# Patient Record
Sex: Female | Born: 1945
Health system: Southern US, Community
[De-identification: ages and names within clinical notes are randomized; demographics above are authoritative.]

## PROBLEM LIST (undated history)

## (undated) DIAGNOSIS — R0602 Shortness of breath: Secondary | ICD-10-CM

## (undated) DIAGNOSIS — I447 Left bundle-branch block, unspecified: Secondary | ICD-10-CM

## (undated) DIAGNOSIS — I499 Cardiac arrhythmia, unspecified: Secondary | ICD-10-CM

## (undated) DIAGNOSIS — Z0389 Encounter for observation for other suspected diseases and conditions ruled out: Secondary | ICD-10-CM

## (undated) DIAGNOSIS — Z7901 Long term (current) use of anticoagulants: Secondary | ICD-10-CM

## (undated) DIAGNOSIS — I10 Essential (primary) hypertension: Secondary | ICD-10-CM

## (undated) DIAGNOSIS — I480 Paroxysmal atrial fibrillation: Secondary | ICD-10-CM

## (undated) HISTORY — PX: BREAST EXCISIONAL BIOPSY: SUR124

## (undated) HISTORY — DX: Encounter for observation for other suspected diseases and conditions ruled out: Z03.89

## (undated) HISTORY — DX: Paroxysmal atrial fibrillation: I48.0

## (undated) HISTORY — PX: CHOLECYSTECTOMY: SHX55

## (undated) HISTORY — PX: SHOULDER SURGERY: SHX246

## (undated) HISTORY — PX: BREAST SURGERY: SHX581

## (undated) HISTORY — DX: Left bundle-branch block, unspecified: I44.7

## (undated) HISTORY — DX: Long term (current) use of anticoagulants: Z79.01

## (undated) HISTORY — PX: WRIST FRACTURE SURGERY: SHX121

## (undated) HISTORY — PX: APPENDECTOMY: SHX54

---

## 1999-03-14 ENCOUNTER — Other Ambulatory Visit: Admission: RE | Admit: 1999-03-14 | Discharge: 1999-03-14 | Payer: Self-pay | Admitting: Family Medicine

## 1999-09-23 ENCOUNTER — Other Ambulatory Visit: Admission: RE | Admit: 1999-09-23 | Discharge: 1999-09-23 | Payer: Self-pay | Admitting: Family Medicine

## 2000-05-12 ENCOUNTER — Other Ambulatory Visit: Admission: RE | Admit: 2000-05-12 | Discharge: 2000-05-12 | Payer: Self-pay | Admitting: Family Medicine

## 2000-05-15 ENCOUNTER — Encounter: Admission: RE | Admit: 2000-05-15 | Discharge: 2000-05-15 | Payer: Self-pay | Admitting: Family Medicine

## 2000-05-15 ENCOUNTER — Encounter: Payer: Self-pay | Admitting: Family Medicine

## 2000-05-25 ENCOUNTER — Encounter: Admission: RE | Admit: 2000-05-25 | Discharge: 2000-05-25 | Payer: Self-pay | Admitting: Family Medicine

## 2000-05-25 ENCOUNTER — Encounter: Payer: Self-pay | Admitting: Family Medicine

## 2000-12-25 ENCOUNTER — Inpatient Hospital Stay (HOSPITAL_COMMUNITY): Admission: EM | Admit: 2000-12-25 | Discharge: 2000-12-29 | Payer: Self-pay | Admitting: *Deleted

## 2000-12-25 ENCOUNTER — Encounter: Payer: Self-pay | Admitting: General Surgery

## 2000-12-25 ENCOUNTER — Encounter (INDEPENDENT_AMBULATORY_CARE_PROVIDER_SITE_OTHER): Payer: Self-pay | Admitting: Specialist

## 2000-12-27 ENCOUNTER — Encounter: Payer: Self-pay | Admitting: General Surgery

## 2001-07-09 ENCOUNTER — Encounter: Payer: Self-pay | Admitting: Family Medicine

## 2001-07-09 ENCOUNTER — Encounter: Admission: RE | Admit: 2001-07-09 | Discharge: 2001-07-09 | Payer: Self-pay | Admitting: Family Medicine

## 2001-07-19 ENCOUNTER — Encounter: Payer: Self-pay | Admitting: Family Medicine

## 2001-07-19 ENCOUNTER — Encounter: Admission: RE | Admit: 2001-07-19 | Discharge: 2001-07-19 | Payer: Self-pay | Admitting: Family Medicine

## 2002-10-27 DIAGNOSIS — IMO0001 Reserved for inherently not codable concepts without codable children: Secondary | ICD-10-CM

## 2002-10-27 HISTORY — DX: Reserved for inherently not codable concepts without codable children: IMO0001

## 2002-10-27 HISTORY — PX: LEFT HEART CATH: CATH118248

## 2003-06-22 ENCOUNTER — Emergency Department (HOSPITAL_COMMUNITY): Admission: EM | Admit: 2003-06-22 | Discharge: 2003-06-22 | Payer: Self-pay | Admitting: Emergency Medicine

## 2003-06-22 ENCOUNTER — Encounter: Payer: Self-pay | Admitting: *Deleted

## 2003-07-25 ENCOUNTER — Ambulatory Visit (HOSPITAL_COMMUNITY): Admission: RE | Admit: 2003-07-25 | Discharge: 2003-07-25 | Payer: Self-pay | Admitting: *Deleted

## 2004-06-03 ENCOUNTER — Other Ambulatory Visit: Admission: RE | Admit: 2004-06-03 | Discharge: 2004-06-03 | Payer: Self-pay | Admitting: Family Medicine

## 2005-10-16 ENCOUNTER — Ambulatory Visit (HOSPITAL_COMMUNITY): Admission: RE | Admit: 2005-10-16 | Discharge: 2005-10-16 | Payer: Self-pay | Admitting: *Deleted

## 2006-01-16 ENCOUNTER — Encounter (INDEPENDENT_AMBULATORY_CARE_PROVIDER_SITE_OTHER): Payer: Self-pay | Admitting: Specialist

## 2006-01-16 ENCOUNTER — Ambulatory Visit (HOSPITAL_COMMUNITY): Admission: RE | Admit: 2006-01-16 | Discharge: 2006-01-16 | Payer: Self-pay | Admitting: General Surgery

## 2006-08-06 ENCOUNTER — Encounter: Admission: RE | Admit: 2006-08-06 | Discharge: 2006-08-06 | Payer: Self-pay | Admitting: Family Medicine

## 2006-10-08 ENCOUNTER — Other Ambulatory Visit: Admission: RE | Admit: 2006-10-08 | Discharge: 2006-10-08 | Payer: Self-pay | Admitting: Family Medicine

## 2009-09-26 ENCOUNTER — Other Ambulatory Visit: Admission: RE | Admit: 2009-09-26 | Discharge: 2009-09-26 | Payer: Self-pay | Admitting: Family Medicine

## 2009-09-27 ENCOUNTER — Ambulatory Visit: Payer: Self-pay | Admitting: Diagnostic Radiology

## 2009-09-27 ENCOUNTER — Emergency Department (HOSPITAL_BASED_OUTPATIENT_CLINIC_OR_DEPARTMENT_OTHER): Admission: EM | Admit: 2009-09-27 | Discharge: 2009-09-27 | Payer: Self-pay | Admitting: Emergency Medicine

## 2009-11-07 ENCOUNTER — Encounter: Admission: RE | Admit: 2009-11-07 | Discharge: 2010-01-22 | Payer: Self-pay | Admitting: Family Medicine

## 2010-02-06 ENCOUNTER — Encounter: Admission: RE | Admit: 2010-02-06 | Discharge: 2010-04-02 | Payer: Self-pay | Admitting: Orthopedic Surgery

## 2011-03-14 NOTE — Cardiovascular Report (Signed)
Alexandra Rogers, Alexandra Rogers                     ACCOUNT NO.:  [id number]   MEDICAL RECORD NO.:  [id number]                   PATIENT TYPE:  OIB   LOCATION:  2852                                 FACILITY:  [facility]   PHYSICIAN:  Meade Maw, M.D.                 DATE OF BIRTH:  Jul 18, 1946   DATE OF PROCEDURE:  07/25/2003  DATE OF DISCHARGE:                              CARDIAC CATHETERIZATION   INDICATION FOR PROCEDURE:  A 65 year old female noted to have presyncope and  exercise induced left bundle branch block.  She underwent a stress  Cardiolite.  This revealed decreased uptake in the anterior septal region  with minimal reversal.  After considering her options, the patient wishes to  proceed with left heart catheterization.   PROCEDURE:  After obtaining written informed consent, the patient was  brought to the cardiac catheterization lab in a postabsorptive state.  Preop  sedation was achieved using IV Versed.  The right groin was prepped and  draped in the usual sterile fashion.  Local anesthesia was achieved using 1%  Xylocaine.  A 6 French hemostasis sheath was placed into the right femoral  artery.  Selective coronary angiography was performed with a JL-4 and JR-4  Judkins catheter.  Multiple views were obtained.  All catheter exchanges  were made over a guide wire.  Single plane ventriculogram was performed in  the RAO position using a 6 French pigtail curved catheter.  There was no  identifiable disease.  There was spasm noted in the left main coronary  artery with initial introduction of the catheter.  This resolved with 200  mcg of intracoronary nitroglycerin.   FINDINGS:  1. Aortic pressure was 125/67.  2. LV pressure 126/5.  3. EDP 14.  4. Single plane ventriculogram revealed normal wall motion.  Ejection     fraction of 65%.   CORONARY ANGIOGRAPHY:  1. The left main coronary artery bifurcates into the left anterior     descending and circumflex vessel.  There is  no disease noted in the left     main coronary artery.  2. LAD:  Left anterior descending gives rise to a trivial D1, trivial D2,     large D3 and goes on in as an apical branch.  There is no disease in the     left anterior descending or its branches.  3. Circumflex:  Circumflex vessel gives rise to a trivial OM-1, large OM-2,     moderate OM-3 and goes on in as a lateral branch.  There is no disease     noted in the circumflex vessel or its branches.  4. The right coronary artery:  The right coronary artery is dominant for the     posterior circulation.  It gives rise to a trivial RV marginal one and     two, large PDA, large PL branch.  There is no disease noted in the right  coronary artery or its branches.   FINAL IMPRESSION:  1. Normal coronary angiography and normal single plane ventriculogram with     ejection fraction of 65%.  2.     Exercise induced left bundle branch block not attributable to coronary     artery disease.  3. Paroxysmal atrial fibrillation.  The patient has no other risk factors     for thromboembolism.  Therefore, will not initiate Coumadin as this time.                                                 Meade Maw, M.D.    HP/MEDQ  D:  07/25/2003  T:  07/25/2003  Job:  [id number]

## 2011-03-14 NOTE — H&P (Signed)
[facility]  Patient:    Alexandra Rogers, Alexandra Rogers                  MRN: [id number] Adm. Date:  [id number] Attending:  Cherylynn Ridges                         History and Physical  IDENTIFICATION AND CHIEF COMPLAINT:  The patient is a 65 year old female with likely acute appendicitis.  HISTORY OF PRESENT ILLNESS:  The patient was in her usual state of good health until she developed acute onset of mid-upper and right lower quadrant abdominal pain associated with fevers and chills, but no nausea or vomiting and no diarrhea or constipation.  She went to see an urgent care family practice doctor who thought the patient to likely have acute appendicitis; her WBC count at that facility was 20,000 and she had a left shift.  She also had a fever.  She had right lower quadrant tenderness with rebound guarding and she was thought to have acute appendicitis and she was sent into the emergency room for evaluation.  PAST MEDICAL HISTORY:  Unremarkable.  She has no history of coronary, renal, pulmonary or liver disease.  She is a nonsmoker and nondrinker.  She is gravida 2, para 2.  MEDICATIONS:  Prempro only.  ALLERGIES:  She has no known drug allergies.  FAMILY HISTORY:  There is a family history of Crohns disease but no other disease.  PHYSICAL EXAMINATION  VITAL SIGNS:  She has a temperature of 102.3.  Her pulse is 114.  Blood pressure is normal.  HEENT:  She is normocephalic, atraumatic and anicteric.  NECK:  Supple.  CHEST:  Clear.  ABDOMEN:  Soft but tender in the right lower quadrant with voluntary guarding and rebound in that area.  Positive Rovsing sign.  She has present but hypoactive bowel sounds.  The patient is hungry.  RECTAL:  There is a palpable mass on the right side in the upper portion of the pelvis on rectal examination.  PELVIC:  Deferred per previous exam by the outside physician who felt the patient had adnexal tenderness on the  right side but could have been the appendix.  NEUROLOGIC:  Cranial nerves II-XII are grossly intact.  LABORATORY AND X-RAY FINDINGS:  She has a WBC count of 20,100 with a left shift.  Her hematocrit is normal.  Twelve-lead EKG shows possible septal infarct but this is not seen to be the case from me.  She is at sinus tachycardia and 100.  Upright portable chest x-ray is unremarkable.  IMPRESSION:  Likely acute appendicitis with possible rupture in an otherwise healthy 65 year old female who is moderately obese.  PLAN:  The plan is for formal laparoscopic appendectomy and possible open procedure.  Based on her rectal examination and her increased WBC count, there is a chance that this could be a ruptured appendix or gangrenous appendix.  We will go to OR for removal as soon as possible; currently, the OR is full with cases. DD:  12/25/00 TD:  12/28/00 Job: [id number]

## 2011-03-14 NOTE — Discharge Summary (Signed)
[facility]  Patient:    Alexandra Rogers, Alexandra Rogers                  MRN: [id number] Adm. Date:  [id number] Disc. Date: [id number] Attending:  Cherylynn Ridges                           Discharge Summary  DISCHARGE DIAGNOSES: 1. Acute appendicitis. 2. Likely community-acquired pneumonia.  PROCEDURE:  Laparoscopic appendectomy.  SURGEON:  Jimmye Norman, M.D.  DISCHARGE MEDICATIONS: 1. Tequin 400 mg p.o. q.d. for 5 days. 2. Vicodin 1-2 q.4h. p.r.n. pain.  DIET:  Regular.  CONDITION ON DISCHARGE:  Stable.  ACTIVITY:  No lifting greater than 25 pounds for a week and actually no driving for a week.  HOSPITAL COURSE:  The patient is an otherwise healthy 65 year old female who came in with abdominal pain localized to the right lower quadrant.  Without a CT scan, she was taken to surgery where laparoscopic appendectomy was done for acute suppurative appendicitis with gangrenous changes.  Postoperatively, the patient had fevers up to 104.0 for the first day and then subsequently came down.  We maintained her on Sulfatrim and then changed her to Tequin for what appeared to be a community-acquired pneumonia on the right side.  Chest x-ray confirmed this.  She has subsequently in the last 24 hours had a temperature maximum of 100.3 and she was 99.3 at the time of discharge.  She otherwise felt well, was eating well, voiding well, having no problems with the wounds which looked well with no evidence of acute infection.  FOLLOW-UP:  She is to follow up to see me on the 12th for wound check. DD:  12/29/00 TD:  12/29/00 Job: [id number]

## 2011-03-14 NOTE — Op Note (Signed)
[facility]  Patient:    Alexandra Rogers, Alexandra Rogers                  MRN: [id number] Proc. Date: 12/26/00 Adm. Date:  [id number] Attending:  Cherylynn Ridges                           Operative Report  PREOPERATIVE DIAGNOSES:  Acute appendicitis possible rupture.  POSTOPERATIVE DIAGNOSES:  Acute appendicitis with gangrenous changes without rupture.  PROCEDURE:  Laparoscopic appendectomy.  SURGEON:  Dr. Lindie Spruce.  ASSISTANT:  None.  ANESTHESIA:  General endotracheal.  ESTIMATED BLOOD LOSS:  Less than 20 cc.  COMPLICATIONS:  None.  CONDITION:  Stable.  FINDINGS:  Acutely inflamed gangrenous appendix in the mid portion without perforation with a small amount of purulent fluid in the cul-de-sac.  INDICATIONS FOR PROCEDURE:  The patient is a 65 year old female seen earlier today with abdominal pain localized to the right lower quadrant with fever and a white count of 20,000 who now comes in for an appendectomy.  DESCRIPTION OF PROCEDURE:  The patient was taken to the operating room, placed on the table in supine position. After an adequate general anesthetic was administered, she was prepped and draped in the usual sterile manner exposing the midline and the right upper and lower quadrants of the abdomen.  A supraumbilical curvilinear incision was made using a #11 blade down to the midline fascia. It was noted at that time, the patient did have a superior umbilical hernia and this was repaired during the closure of the case. A Veress needle was passed through the supraumbilical fascia into the peritoneal cavity and confirmed to be in position using the ______ test. We subsequently inflated carbon dioxide gas through this and a Veress needle into the peritoneal cavity up to a maximal intra-abdominal pressure of 15 mmHg. We subsequently passed an 11 mm cannula into the peritoneal cavity and confirmed it to be position using a laparoscope with attached camera  and light source. Once this was done, a 5 mm right upper quadrant cannula and a suprapubic 12 mm cannula passed into the peritoneal cavity under direct vision. Subsequently the patient was placed in Trendelenburg and dissection begun.  There were adhesions tinting a tenting at the ascending colon to the anterior abdominal wall. These were taken down using electrocautery and endoshears. Once this was done, the acutely inflamed appendix was easily seen and followed down towards the pelvis. We were able to mobilize that towards the right lower quadrant. We opened up a window between the base of the appendix at the cecum and the mesoappendix and through this an endoGIA with 3.5 mm staples were passed across it and subsequently fired transecting the appendix from the base of the cecum. We subsequently mobilized and isolated out the mesoappendix and used a 2.5 mm endoGIA stapler to transect the mesoappendix. Once this was done, the appendix was able to be brought out through the cannula primarily in the suprapubic fascial site. Once this was done, we irrigated the right lower quadrant with copious amounts of saline. We irrigated out the cul-de-sac, aspirated most of the fluid. There was minimal bleeding and all cannulas were removed. Approximately 2 liters of saline solution was used to irrigate out the pelvis and the abdomen. The supraumbilical fascia along with the umbilical hernia was repaired using a figure-of-eight stitch of #0 Vicryl and then the skin was closed using running subcuticular stitch of 4-0 Vicryl.  This was done at all sites. A 0.25% Marcaine with epinephrine was injected at the incision site. Once the skin was closed, Steri-Strips were applied and a sterile dressing. All needle counts, sponge counts, and instrument counts were correct at the conclusion of the case. DD:  12/26/00 TD:  12/28/00 Job: [id number]

## 2011-03-14 NOTE — Op Note (Signed)
Alexandra Rogers, Alexandra Rogers           ACCOUNT NO.:  [id number]   MEDICAL RECORD NO.:  [id number]          PATIENT TYPE:  AMB   LOCATION:  SDS                          FACILITY:  [facility]   PHYSICIAN:  Cherylynn Ridges, M.D.    DATE OF BIRTH:  1946/04/28   DATE OF PROCEDURE:  01/16/2006  DATE OF DISCHARGE:  01/16/2006                                 OPERATIVE REPORT   PREOPERATIVE DIAGNOSIS:  Symptomatic cholelithiasis.   POSTOPERATIVE DIAGNOSIS:  Symptomatic cholelithiasis.   PROCEDURE:  Laparoscopic cholecystectomy with cholangiogram.   SURGEON:  Cherylynn Ridges, M.D.   ANESTHESIA:  General endotracheal.   ESTIMATED BLOOD LOSS:  Less than 20 mL.   COMPLICATIONS:  None.   CONDITION:  Stable.   FINDINGS:  Normal cholangiogram with no evidence of acute cholecystitis.   INDICATIONS FOR OPERATION:  The patient is a 65 year old female with  abdominal pain and stones noted on ultrasound, who now comes in for an  elective laparoscopic cholecystectomy.   OPERATION:  The patient was taken to the operating room and placed on the  table in a supine position.  After an adequate general anesthetic was  administered, she was prepped and draped in the usual sterile manner,  exposing midline and the right upper quadrant.   An infraumbilical curvilinear incision was made using a #11 blade and taken  down to the midline fascia.  This fascia was grasped with Kocher clamps and  an incision made between the 2 clamps into the fascia.  We then bluntly  dissected down into the peritoneal cavity and passed a pursestring suture of  0 Vicryl in the fascial opening.  This help secure a Hasson cannula, which  was subsequently passed into the peritoneal cavity, through which of carbon  dioxide gas was this insufflated up to maximal intra-abdominal pressure of  15 mmHg.   We then passed 2 right costal margin 5-mm cannulas and a subxiphoid 11/12-mm  cannula under direct vision.  With all cannulas in place, the  patient was  placed in reversed Trendelenburg.  The left-side was tilted down and the  dissection begun.   The patient's abdomen was free of significant adhesions, although she did  have some scarring in the right lower quadrant from her previous  appendectomy.  The gallbladder itself was not tense, was able to be grasped  and retracted towards the anterior abdominal wall and the right upper  quadrant using a ratcheted grasper.  We placed a second one on the  infundibulum, which help [place] expose the hilum and the triangle of Fallot and  hepatoduodenal triangle.  During this, we were able to dissect out the  peritoneum and then dissected out the cystic duct and the cystic artery.  The duct itself was clipped along the gallbladder side and a choledochotomy  made using the laparoscopic scissors.  This allowed US performed a  cholangiogram through a [facility], which was passed through the  anterior abdominal wall and cannulated into the distal cystic duct.  The  cholangiogram showed good flow into the duodenum, good proximal filling, no  filling defects and no dilatation.  Once the cholangiogram was completed, we removed the laparoscopic clip, was  then held the catheter in place, removed the cannula and then placed 3 clips  on the distal cystic duct.  We then transected the cystic duct, dissected  out the cystic artery, clipped it proximally and distally x2, transected it,  then dissected out the gallbladder from the bed with minimal difficulty.   Once this was done, we removed the gallbladder in total from the  infraumbilical site, closing off the fascia that with a pursestring suture.  It was reinforced with a second figure-of-eight stitch of 0 Vicryl.   Once the fascia in this site was closed, we aspirated all fluid gas from  above the liver as we removed all cannulas.  We then injected 0.25% Marcaine  with epinephrine at all sites and then closed the skin using a running   subcuticular stitch of 4-0 Vicryl.  Sterile dressings were applied.  All  needle counts, sponge counts and instrument counts were correct at the  conclusion of the case.      Cherylynn Ridges, M.D.  Electronically Signed     JOW/MEDQ  D:  01/16/2006  T:  01/19/2006  Job:  [id number]   cc:   Meade Maw, M.D.  Fax: [phone]

## 2012-08-27 ENCOUNTER — Other Ambulatory Visit: Payer: Self-pay | Admitting: Family Medicine

## 2012-08-27 DIAGNOSIS — N950 Postmenopausal bleeding: Secondary | ICD-10-CM

## 2012-08-31 ENCOUNTER — Ambulatory Visit
Admission: RE | Admit: 2012-08-31 | Discharge: 2012-08-31 | Disposition: A | Discharge status: Home / Self Care | Payer: BC Managed Care – PPO | Source: Ambulatory Visit | Attending: Family Medicine | Admitting: Family Medicine

## 2012-08-31 DIAGNOSIS — N950 Postmenopausal bleeding: Secondary | ICD-10-CM

## 2012-09-02 ENCOUNTER — Other Ambulatory Visit (HOSPITAL_COMMUNITY)
Admission: RE | Admit: 2012-09-02 | Discharge: 2012-09-02 | Disposition: A | Discharge status: Home / Self Care | Payer: BC Managed Care – PPO | Source: Ambulatory Visit | Attending: Obstetrics and Gynecology | Admitting: Obstetrics and Gynecology

## 2012-09-02 ENCOUNTER — Other Ambulatory Visit: Payer: Self-pay | Admitting: Obstetrics and Gynecology

## 2012-09-02 DIAGNOSIS — Z01419 Encounter for gynecological examination (general) (routine) without abnormal findings: Secondary | ICD-10-CM | POA: Insufficient documentation

## 2012-09-02 DIAGNOSIS — Z1151 Encounter for screening for human papillomavirus (HPV): Secondary | ICD-10-CM | POA: Insufficient documentation

## 2012-09-10 ENCOUNTER — Other Ambulatory Visit: Payer: Self-pay | Admitting: Obstetrics and Gynecology

## 2012-09-13 ENCOUNTER — Encounter (HOSPITAL_COMMUNITY): Payer: Self-pay | Admitting: Pharmacist

## 2012-09-20 ENCOUNTER — Encounter (HOSPITAL_COMMUNITY): Payer: Self-pay

## 2012-09-20 ENCOUNTER — Encounter (HOSPITAL_COMMUNITY)
Admission: RE | Admit: 2012-09-20 | Discharge: 2012-09-20 | Disposition: A | Discharge status: Home / Self Care | Payer: BC Managed Care – PPO | Source: Ambulatory Visit | Attending: Obstetrics and Gynecology | Admitting: Obstetrics and Gynecology

## 2012-09-20 ENCOUNTER — Other Ambulatory Visit: Payer: Self-pay

## 2012-09-20 HISTORY — DX: Shortness of breath: R06.02

## 2012-09-20 HISTORY — DX: Cardiac arrhythmia, unspecified: I49.9

## 2012-09-20 LAB — CBC
MCH: 31.5 pg (ref 26.0–34.0)
MCV: 95.4 fL (ref 78.0–100.0)
Platelets: 306 10*3/uL (ref 150–400)
RBC: 4.16 MIL/uL (ref 3.87–5.11)
RDW: 13.3 % (ref 11.5–15.5)

## 2012-09-20 LAB — BASIC METABOLIC PANEL
CO2: 29 mEq/L (ref 19–32)
Calcium: 9.1 mg/dL (ref 8.4–10.5)
Creatinine, Ser: 0.92 mg/dL (ref 0.50–1.10)
GFR calc non Af Amer: 64 mL/min — ABNORMAL LOW (ref >90)
Glucose, Bld: 84 mg/dL (ref 70–99)
Sodium: 139 mEq/L (ref 135–145)

## 2012-09-20 NOTE — Pre-Procedure Instructions (Signed)
No EKG on file at Dr. Sherilyn Cooter Smith's office to compare to today's EKG. Pt has not seen Dr. Katrinka Blazing since before 2010, and no records in office show EKG. Pt states she saw Dr Katrinka Blazing for an arrhythmia and he told her tests were negative.

## 2012-09-20 NOTE — Patient Instructions (Addendum)
Your procedure is scheduled on:09/22/12  Enter through the Main Entrance at :10 am  Pick up desk phone and dial [phone] and inform us of your arrival.  Please call [phone] if you have any problems the morning of surgery.  Remember: Do not eat after midnight: Tuesday    Take these meds the morning of surgery with a sip of water: Metoprolol, Diovan  DO NOT wear jewelry, eye make-up, lipstick,body lotion, or dark fingernail polish. Do not shave for 48 hours prior to surgery.  If you are to be admitted after surgery, leave suitcase in car until your room has been assigned. Patients discharged on the day of surgery will not be allowed to drive home.

## 2012-09-21 MED ORDER — DEXTROSE 5 % IV SOLN
3.0000 g | INTRAVENOUS | Status: AC
  Administered 2012-09-22: 3 g via INTRAVENOUS
  Filled 2012-09-21: qty 3000

## 2012-09-22 ENCOUNTER — Encounter (HOSPITAL_COMMUNITY): Payer: Self-pay | Admitting: Anesthesiology

## 2012-09-22 ENCOUNTER — Ambulatory Visit (HOSPITAL_COMMUNITY)
Admission: RE | Admit: 2012-09-22 | Discharge: 2012-09-22 | Disposition: A | Discharge status: Home / Self Care | Payer: BC Managed Care – PPO | Source: Ambulatory Visit | Attending: Obstetrics and Gynecology | Admitting: Obstetrics and Gynecology

## 2012-09-22 ENCOUNTER — Ambulatory Visit (HOSPITAL_COMMUNITY): Payer: BC Managed Care – PPO | Admitting: Anesthesiology

## 2012-09-22 ENCOUNTER — Encounter (HOSPITAL_COMMUNITY): Payer: Self-pay | Admitting: *Deleted

## 2012-09-22 ENCOUNTER — Encounter (HOSPITAL_COMMUNITY)
Admission: RE | Disposition: A | Discharge status: Home / Self Care | Payer: Self-pay | Source: Ambulatory Visit | Attending: Obstetrics and Gynecology

## 2012-09-22 DIAGNOSIS — Z9889 Other specified postprocedural states: Secondary | ICD-10-CM

## 2012-09-22 DIAGNOSIS — R9389 Abnormal findings on diagnostic imaging of other specified body structures: Secondary | ICD-10-CM | POA: Insufficient documentation

## 2012-09-22 DIAGNOSIS — N950 Postmenopausal bleeding: Secondary | ICD-10-CM | POA: Diagnosis present

## 2012-09-22 HISTORY — PX: HYSTEROSCOPY W/D&C: SHX1775

## 2012-09-22 SURGERY — DILATATION AND CURETTAGE /HYSTEROSCOPY
Anesthesia: General | Site: Vagina | Wound class: Clean Contaminated

## 2012-09-22 MED ORDER — PHENYLEPHRINE HCL 10 MG/ML IJ SOLN
INTRAMUSCULAR | Status: DC | PRN
  Administered 2012-09-22: 80 ug via INTRAVENOUS
  Administered 2012-09-22: 60 ug via INTRAVENOUS

## 2012-09-22 MED ORDER — LIDOCAINE HCL (CARDIAC) 20 MG/ML IV SOLN
INTRAVENOUS | Status: DC | PRN
  Administered 2012-09-22: 20 mg via INTRAVENOUS

## 2012-09-22 MED ORDER — IBUPROFEN 600 MG PO TABS: 600.0000 mg | ORAL_TABLET | Freq: Four times a day (QID) | ORAL | Status: DC | PRN

## 2012-09-22 MED ORDER — KETOROLAC TROMETHAMINE 30 MG/ML IJ SOLN
INTRAMUSCULAR | Status: DC | PRN
  Administered 2012-09-22: 30 mg via INTRAVENOUS

## 2012-09-22 MED ORDER — GLYCOPYRROLATE 0.2 MG/ML IJ SOLN
INTRAMUSCULAR | Status: DC | PRN
  Administered 2012-09-22: 0.3 mg via INTRAVENOUS

## 2012-09-22 MED ORDER — KETOROLAC TROMETHAMINE 30 MG/ML IJ SOLN
INTRAMUSCULAR | Status: AC
  Filled 2012-09-22: qty 1

## 2012-09-22 MED ORDER — MISOPROSTOL 200 MCG PO TABS: 200.0000 ug | ORAL_TABLET | Freq: Four times a day (QID) | ORAL | Status: DC

## 2012-09-22 MED ORDER — LIDOCAINE HCL 2 % IJ SOLN
INTRAMUSCULAR | Status: AC
  Filled 2012-09-22: qty 20

## 2012-09-22 MED ORDER — ONDANSETRON HCL 4 MG/2ML IJ SOLN
INTRAMUSCULAR | Status: AC
  Filled 2012-09-22: qty 2

## 2012-09-22 MED ORDER — DIPHENHYDRAMINE HCL 50 MG/ML IJ SOLN
INTRAMUSCULAR | Status: AC
  Filled 2012-09-22: qty 1

## 2012-09-22 MED ORDER — IBUPROFEN 800 MG PO TABS: 800.0000 mg | ORAL_TABLET | Freq: Once | ORAL | Status: DC

## 2012-09-22 MED ORDER — FENTANYL CITRATE 0.05 MG/ML IJ SOLN
INTRAMUSCULAR | Status: DC | PRN
  Administered 2012-09-22 (×2): 50 ug via INTRAVENOUS

## 2012-09-22 MED ORDER — ONDANSETRON HCL 4 MG/2ML IJ SOLN: 4.0000 mg | Freq: Once | INTRAMUSCULAR | Status: DC | PRN

## 2012-09-22 MED ORDER — DIPHENHYDRAMINE HCL 50 MG/ML IJ SOLN
INTRAMUSCULAR | Status: DC | PRN
  Administered 2012-09-22 (×2): 12.5 mg via INTRAVENOUS

## 2012-09-22 MED ORDER — MEPERIDINE HCL 25 MG/ML IJ SOLN: 6.2500 mg | INTRAMUSCULAR | Status: DC | PRN

## 2012-09-22 MED ORDER — LIDOCAINE HCL 2 % IJ SOLN
INTRAMUSCULAR | Status: DC | PRN
  Administered 2012-09-22: 10 mL

## 2012-09-22 MED ORDER — FENTANYL CITRATE 0.05 MG/ML IJ SOLN
25.0000 ug | INTRAMUSCULAR | Status: DC | PRN
  Administered 2012-09-22: 50 ug via INTRAVENOUS

## 2012-09-22 MED ORDER — GLYCINE 1.5 % IR SOLN
Status: DC | PRN
  Administered 2012-09-22: 3000 mL

## 2012-09-22 MED ORDER — SILVER NITRATE-POT NITRATE 75-25 % EX MISC
CUTANEOUS | Status: AC
  Filled 2012-09-22: qty 1

## 2012-09-22 MED ORDER — KETOROLAC TROMETHAMINE 30 MG/ML IJ SOLN: 15.0000 mg | Freq: Once | INTRAMUSCULAR | Status: DC | PRN

## 2012-09-22 MED ORDER — ONDANSETRON HCL 4 MG/2ML IJ SOLN
INTRAMUSCULAR | Status: DC | PRN
  Administered 2012-09-22: 4 mg via INTRAVENOUS

## 2012-09-22 MED ORDER — LIDOCAINE HCL (CARDIAC) 20 MG/ML IV SOLN
INTRAVENOUS | Status: AC
  Filled 2012-09-22: qty 5

## 2012-09-22 MED ORDER — PROPOFOL 10 MG/ML IV EMUL
INTRAVENOUS | Status: AC
  Filled 2012-09-22: qty 20

## 2012-09-22 MED ORDER — MIDAZOLAM HCL 2 MG/2ML IJ SOLN
INTRAMUSCULAR | Status: AC
  Filled 2012-09-22: qty 2

## 2012-09-22 MED ORDER — LACTATED RINGERS IV SOLN
INTRAVENOUS | Status: DC
  Administered 2012-09-22 (×2): via INTRAVENOUS

## 2012-09-22 MED ORDER — PROPOFOL 10 MG/ML IV EMUL
INTRAVENOUS | Status: DC | PRN
  Administered 2012-09-22: 200 mg via INTRAVENOUS

## 2012-09-22 MED ORDER — GLYCOPYRROLATE 0.2 MG/ML IJ SOLN
INTRAMUSCULAR | Status: AC
  Filled 2012-09-22: qty 2

## 2012-09-22 MED ORDER — FENTANYL CITRATE 0.05 MG/ML IJ SOLN
INTRAMUSCULAR | Status: AC
  Filled 2012-09-22: qty 2

## 2012-09-22 MED ORDER — FENTANYL CITRATE 0.05 MG/ML IJ SOLN
INTRAMUSCULAR | Status: AC
  Administered 2012-09-22: 50 ug via INTRAVENOUS
  Filled 2012-09-22: qty 2

## 2012-09-22 SURGICAL SUPPLY — 14 items
CANISTER SUCTION 2500CC (MISCELLANEOUS) ×2 IMPLANT
CATH ROBINSON RED A/P 16FR (CATHETERS) ×2 IMPLANT
CLOTH BEACON ORANGE TIMEOUT ST (SAFETY) ×2 IMPLANT
CONTAINER PREFILL 10% NBF 60ML (FORM) ×4 IMPLANT
DILATOR CANAL MILEX (MISCELLANEOUS) ×2 IMPLANT
DRESSING TELFA 8X3 (GAUZE/BANDAGES/DRESSINGS) ×2 IMPLANT
GLOVE BIO SURGEON STRL SZ7 (GLOVE) ×2 IMPLANT
GLOVE BIOGEL PI IND STRL 7.0 (GLOVE) ×2 IMPLANT
GLOVE BIOGEL PI INDICATOR 7.0 (GLOVE) ×2
GOWN STRL REIN XL XLG (GOWN DISPOSABLE) ×4 IMPLANT
PACK HYSTEROSCOPY LF (CUSTOM PROCEDURE TRAY) ×2 IMPLANT
PAD OB MATERNITY 4.3X12.25 (PERSONAL CARE ITEMS) ×2 IMPLANT
TOWEL OR 17X24 6PK STRL BLUE (TOWEL DISPOSABLE) ×4 IMPLANT
WATER STERILE IRR 1000ML POUR (IV SOLUTION) ×2 IMPLANT

## 2012-09-22 NOTE — Anesthesia Preprocedure Evaluation (Signed)
Anesthesia Evaluation  Patient identified by MRN, date of birth, ID band Patient awake    Reviewed: Allergy & Precautions, H&P , NPO status , Patient's Chart, lab work & pertinent test results  Airway Mallampati: II TM Distance: >3 FB Neck ROM: full    Dental No notable dental hx. (+) Teeth Intact   Pulmonary          Cardiovascular hypertension, Pt. on home beta blockers     Neuro/Psych negative neurological ROS  negative psych ROS   GI/Hepatic negative GI ROS, Neg liver ROS,   Endo/Other  negative endocrine ROSMorbid obesity  Renal/GU negative Renal ROS  negative genitourinary   Musculoskeletal negative musculoskeletal ROS (+)   Abdominal (+) + obese,   Peds negative pediatric ROS (+)  Hematology negative hematology ROS (+)   Anesthesia Other Findings   Reproductive/Obstetrics negative OB ROS                           Anesthesia Physical Anesthesia Plan  ASA: III  Anesthesia Plan: General   Post-op Pain Management:    Induction: Intravenous  Airway Management Planned: LMA  Additional Equipment:   Intra-op Plan:   Post-operative Plan:   Informed Consent: I have reviewed the patients History and Physical, chart, labs and discussed the procedure including the risks, benefits and alternatives for the proposed anesthesia with the patient or authorized representative who has indicated his/her understanding and acceptance.     Plan Discussed with: CRNA and Surgeon  Anesthesia Plan Comments:         Anesthesia Quick Evaluation
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
## 2012-09-22 NOTE — Transfer of Care (Signed)
Immediate Anesthesia Transfer of Care Note  Patient: Alexandra Rogers  Procedure(s) Performed: Procedure(s) (LRB) with comments: DILATATION AND CURETTAGE /HYSTEROSCOPY (N/A)  Patient Location: PACU  Anesthesia Type:General  Level of Consciousness: awake, sedated and patient cooperative  Airway & Oxygen Therapy: Patient Spontanous Breathing and Patient connected to nasal cannula oxygen  Post-op Assessment: Report given to PACU RN and Post -op Vital signs reviewed and stable  Post vital signs: Reviewed and stable  Complications: No apparent anesthesia complications

## 2012-09-22 NOTE — Brief Op Note (Signed)
09/22/2012  1:00 PM  PATIENT:  Alexandra Rogers  66 y.o. female  PRE-OPERATIVE DIAGNOSIS:  PMB/Thickened EMS, Failed office EMB, Morbid Obesity  POST-OPERATIVE DIAGNOSIS: same  PROCEDURE:  Procedure(s) (LRB) with comments: DILATATION AND CURETTAGE /HYSTEROSCOPY (N/A)  SURGEON:  Surgeon(s) and Role:    * Geryl Rankins, MD - Primary  PHYSICIAN ASSISTANT: None   ASSISTANTS: Technician   ANESTHESIA:   local and general-LMA  EBL:  Total I/O In: 1200 [I.V.:1200] Out: 50 [Urine:50] Deficit 50 cc BLOOD ADMINISTERED:none  DRAINS: none   LOCAL MEDICATIONS USED:  2 % LIDOCAINE, 10 cc  SPECIMEN:  Source of Specimen:  Endometrial currettings  DISPOSITION OF SPECIMEN:  PATHOLOGY  COUNTS:  YES  TOURNIQUET:  * No tourniquets in log *  DICTATION: .Other Dictation: Dictation Number [id number]  PLAN OF CARE: Discharge to home after PACU  PATIENT DISPOSITION:  PACU - hemodynamically stable.   Delay start of Pharmacological VTE agent (>24hrs) due to surgical blood loss or risk of bleeding: not applicable

## 2012-09-22 NOTE — Interval H&P Note (Signed)
History and Physical Interval Note:  09/22/2012 11:54 AM  Alexandra Rogers  has presented today for surgery, with the diagnosis of PMB/Thickened EMS  The various methods of treatment have been discussed with the patient and family. After consideration of risks, benefits and other options for treatment, the patient has consented to  Procedure(s) (LRB) with comments: DILATATION AND CURETTAGE /HYSTEROSCOPY (N/A) as a surgical intervention .  The patient's history has been reviewed, patient examined, no change in status, stable for surgery.  I have reviewed the patient's chart and labs.  Questions were answered to the patient's satisfaction.    Small spotting yesterday. No bleeding today.  Dion Body, Ann Groeneveld

## 2012-09-22 NOTE — Anesthesia Postprocedure Evaluation (Signed)
  Anesthesia Post-op Note  Patient: Alexandra Rogers  Procedure(s) Performed: Procedure(s) (LRB) with comments: DILATATION AND CURETTAGE /HYSTEROSCOPY (N/A)  Patient Location: PACU  Anesthesia Type:General  Level of Consciousness: awake, alert  and oriented  Airway and Oxygen Therapy: Patient Spontanous Breathing  Post-op Pain: none  Post-op Assessment: Post-op Vital signs reviewed, Patient's Cardiovascular Status Stable, Respiratory Function Stable, Patent Airway, No signs of Nausea or vomiting and Pain level controlled  Post-op Vital Signs: Reviewed and stable  Complications: No apparent anesthesia complications

## 2012-09-23 ENCOUNTER — Encounter (HOSPITAL_COMMUNITY): Payer: Self-pay | Admitting: Obstetrics and Gynecology

## 2012-09-23 NOTE — Op Note (Signed)
NAMEANJOLI, DIEMER NO.:  [id number]  MEDICAL RECORD NO.:  [id number]  LOCATION:  [facility]                          FACILITY:  [facility]  PHYSICIAN:  Pieter Partridge, MD   DATE OF BIRTH:  24-Jul-1946  DATE OF PROCEDURE:  09/22/2012 DATE OF DISCHARGE:  09/22/2012                              OPERATIVE REPORT   PREOPERATIVE DIAGNOSES:  Postmenopausal bleeding, thickened endometrial stripe, failed office EMB, and morbid obesity.  POSTOPERATIVE DIAGNOSES:  Postmenopausal bleeding, thickened endometrial stripe, failed office EMB, and morbid obesity.  PROCEDURE:  Hysteroscopy and dilation and curettage.  PHYSICIAN ASSISTANT:  None.  ASSISTANT:  Pensions consultant.  ANESTHESIA:  General, LMA, and local 2% lidocaine 10 mL.  URINE:  50 out 1200 and deficit was 50 mL.  SPECIMENS:  Endometrial curettings to Pathology.  DISPOSITION TO PACU:  Hemodynamically stable.  FINDINGS:  The patient had a stenotic cervix that was dilated easily with smaller dilators.  Endometrial cavity appeared to have some synechiae, proliferative endometrial tissue, and polypoid appearance of tissue.  No discrete mass is noted.  Regarding her pelvic floor, she has significant vaginal wall relaxation.  PROCEDURE IN DETAIL:  The patient was taken to the operating room.  She underwent LMA anesthesia without complication.  She was then placed in the dorsal lithotomy position, prepped and draped in a normal sterile fashion.  The cervix was dilated up to a size 6 Hegar dilator and the hysteroscope was then advanced through the endocervical os and the findings above were noted.  Pictures were taken.  The hysteroscope was then removed and a sharp curettage of all quadrants was performed with a curette.  Not as much tissue returned, as I had anticipated.  At the end, took another look with the hysteroscope and removed it.  On the second pass, I used the polyp forceps and removed the polypoid-appearing tissue  and did another curettage.  I did one more look with the hysteroscope just to make sure that adequate sampling was performed. The second time of curettage, it also felt like there was some scarring potentially, but with the last look with the hysteroscope, there was no evidence of perforation and the uterine cavity appeared to be adequately sampled.  There was a dark linear structure that I thought initially looked like suture, but it was short in length with the hysteroscope running through this structure was flushed out and it was pubic hair that I suspected, that was in the endometrial cavity.  Due to the pelvic wall relaxation, I used the Graves, but I was unable to maintain visualization of the cervix.  We used a longer Graves and that was not adequate as well.  I did a paracervical block, starting with 2-3 mL at the anterior cervix with the single-tooth tenaculum. Once I was able to pull the cervix for that helped with visualization, we optimized as best we could visualization using the weighted speculum and a Deaver in the anterior vagina, so that I could maintain visualization of the cervical os.  At the end of the procedure, the single-tooth tenaculum was removed. The tenaculum sites were hemostatic.  There was no active bleeding from the cervical os.  The patient tolerated the  procedure well.  She was awakened in the recovery room.  She received Ancef 3 g IV prior to the surgery, and she had SCDs on and running.  The procedure was overall probably 30 minutes from start to finish.  However, because of the pelvic prolapse, it was a little bit longer time to find the appropriate instruments to visualize her cervix.     Pieter Partridge, MD     EBV/MEDQ  D:  09/22/2012  T:  09/23/2012  Job:  [id number]

## 2014-10-02 DIAGNOSIS — M25511 Pain in right shoulder: Secondary | ICD-10-CM | POA: Diagnosis not present

## 2014-10-02 DIAGNOSIS — M25561 Pain in right knee: Secondary | ICD-10-CM | POA: Diagnosis not present

## 2014-10-13 DIAGNOSIS — Z23 Encounter for immunization: Secondary | ICD-10-CM | POA: Diagnosis not present

## 2015-01-17 ENCOUNTER — Other Ambulatory Visit: Payer: Self-pay | Admitting: Family Medicine

## 2015-01-17 DIAGNOSIS — Z1231 Encounter for screening mammogram for malignant neoplasm of breast: Secondary | ICD-10-CM

## 2015-03-22 ENCOUNTER — Ambulatory Visit
Admission: RE | Admit: 2015-03-22 | Discharge: 2015-03-22 | Disposition: A | Discharge status: Home / Self Care | Payer: BC Managed Care – PPO | Source: Ambulatory Visit | Attending: Family Medicine | Admitting: Family Medicine

## 2015-03-22 DIAGNOSIS — Z1231 Encounter for screening mammogram for malignant neoplasm of breast: Secondary | ICD-10-CM

## 2015-05-08 DIAGNOSIS — I8312 Varicose veins of left lower extremity with inflammation: Secondary | ICD-10-CM | POA: Diagnosis not present

## 2015-05-08 DIAGNOSIS — I8311 Varicose veins of right lower extremity with inflammation: Secondary | ICD-10-CM | POA: Diagnosis not present

## 2015-05-08 DIAGNOSIS — I83813 Varicose veins of bilateral lower extremities with pain: Secondary | ICD-10-CM | POA: Diagnosis not present

## 2015-07-17 DIAGNOSIS — I10 Essential (primary) hypertension: Secondary | ICD-10-CM | POA: Diagnosis not present

## 2015-07-17 DIAGNOSIS — N950 Postmenopausal bleeding: Secondary | ICD-10-CM | POA: Diagnosis not present

## 2015-07-17 DIAGNOSIS — Z0000 Encounter for general adult medical examination without abnormal findings: Secondary | ICD-10-CM | POA: Diagnosis not present

## 2015-07-17 DIAGNOSIS — Z1239 Encounter for other screening for malignant neoplasm of breast: Secondary | ICD-10-CM | POA: Diagnosis not present

## 2015-07-17 DIAGNOSIS — Z791 Long term (current) use of non-steroidal anti-inflammatories (NSAID): Secondary | ICD-10-CM | POA: Diagnosis not present

## 2015-07-17 DIAGNOSIS — R7301 Impaired fasting glucose: Secondary | ICD-10-CM | POA: Diagnosis not present

## 2015-07-17 DIAGNOSIS — M25569 Pain in unspecified knee: Secondary | ICD-10-CM | POA: Diagnosis not present

## 2015-07-17 DIAGNOSIS — Z23 Encounter for immunization: Secondary | ICD-10-CM | POA: Diagnosis not present

## 2015-07-18 DIAGNOSIS — B354 Tinea corporis: Secondary | ICD-10-CM | POA: Diagnosis not present

## 2015-07-18 DIAGNOSIS — I8310 Varicose veins of unspecified lower extremity with inflammation: Secondary | ICD-10-CM | POA: Diagnosis not present

## 2015-07-18 DIAGNOSIS — N950 Postmenopausal bleeding: Secondary | ICD-10-CM | POA: Diagnosis not present

## 2015-07-18 DIAGNOSIS — M25569 Pain in unspecified knee: Secondary | ICD-10-CM | POA: Diagnosis not present

## 2015-07-18 DIAGNOSIS — R7301 Impaired fasting glucose: Secondary | ICD-10-CM | POA: Diagnosis not present

## 2015-07-18 DIAGNOSIS — I10 Essential (primary) hypertension: Secondary | ICD-10-CM | POA: Diagnosis not present

## 2015-07-18 DIAGNOSIS — Z6841 Body Mass Index (BMI) 40.0 and over, adult: Secondary | ICD-10-CM | POA: Diagnosis not present

## 2015-07-18 DIAGNOSIS — Z23 Encounter for immunization: Secondary | ICD-10-CM | POA: Diagnosis not present

## 2015-07-18 DIAGNOSIS — Z791 Long term (current) use of non-steroidal anti-inflammatories (NSAID): Secondary | ICD-10-CM | POA: Diagnosis not present

## 2015-08-01 DIAGNOSIS — I83891 Varicose veins of right lower extremities with other complications: Secondary | ICD-10-CM | POA: Diagnosis not present

## 2015-08-01 DIAGNOSIS — I83811 Varicose veins of right lower extremities with pain: Secondary | ICD-10-CM | POA: Diagnosis not present

## 2015-08-01 DIAGNOSIS — I8311 Varicose veins of right lower extremity with inflammation: Secondary | ICD-10-CM | POA: Diagnosis not present

## 2015-08-03 DIAGNOSIS — I83811 Varicose veins of right lower extremities with pain: Secondary | ICD-10-CM | POA: Diagnosis not present

## 2015-08-03 DIAGNOSIS — I8311 Varicose veins of right lower extremity with inflammation: Secondary | ICD-10-CM | POA: Diagnosis not present

## 2015-08-09 DIAGNOSIS — I8311 Varicose veins of right lower extremity with inflammation: Secondary | ICD-10-CM | POA: Diagnosis not present

## 2015-08-09 DIAGNOSIS — I83811 Varicose veins of right lower extremities with pain: Secondary | ICD-10-CM | POA: Diagnosis not present

## 2015-08-30 DIAGNOSIS — I8311 Varicose veins of right lower extremity with inflammation: Secondary | ICD-10-CM | POA: Diagnosis not present

## 2015-08-30 DIAGNOSIS — I83811 Varicose veins of right lower extremities with pain: Secondary | ICD-10-CM | POA: Diagnosis not present

## 2015-08-30 DIAGNOSIS — I83891 Varicose veins of right lower extremities with other complications: Secondary | ICD-10-CM | POA: Diagnosis not present

## 2015-09-07 DIAGNOSIS — G8929 Other chronic pain: Secondary | ICD-10-CM | POA: Diagnosis not present

## 2015-09-07 DIAGNOSIS — M7501 Adhesive capsulitis of right shoulder: Secondary | ICD-10-CM | POA: Diagnosis not present

## 2015-09-07 DIAGNOSIS — M25561 Pain in right knee: Secondary | ICD-10-CM | POA: Diagnosis not present

## 2015-09-17 DIAGNOSIS — M7501 Adhesive capsulitis of right shoulder: Secondary | ICD-10-CM | POA: Diagnosis not present

## 2015-09-17 DIAGNOSIS — I8311 Varicose veins of right lower extremity with inflammation: Secondary | ICD-10-CM | POA: Diagnosis not present

## 2015-09-26 DIAGNOSIS — M7501 Adhesive capsulitis of right shoulder: Secondary | ICD-10-CM | POA: Diagnosis not present

## 2015-09-28 DIAGNOSIS — M7501 Adhesive capsulitis of right shoulder: Secondary | ICD-10-CM | POA: Diagnosis not present

## 2015-10-01 DIAGNOSIS — I8311 Varicose veins of right lower extremity with inflammation: Secondary | ICD-10-CM | POA: Diagnosis not present

## 2015-10-01 DIAGNOSIS — I83811 Varicose veins of right lower extremities with pain: Secondary | ICD-10-CM | POA: Diagnosis not present

## 2015-10-01 DIAGNOSIS — M7501 Adhesive capsulitis of right shoulder: Secondary | ICD-10-CM | POA: Diagnosis not present

## 2015-10-04 DIAGNOSIS — M7501 Adhesive capsulitis of right shoulder: Secondary | ICD-10-CM | POA: Diagnosis not present

## 2015-10-08 DIAGNOSIS — M25561 Pain in right knee: Secondary | ICD-10-CM | POA: Diagnosis not present

## 2015-10-08 DIAGNOSIS — M7501 Adhesive capsulitis of right shoulder: Secondary | ICD-10-CM | POA: Diagnosis not present

## 2015-10-08 DIAGNOSIS — G8929 Other chronic pain: Secondary | ICD-10-CM | POA: Diagnosis not present

## 2015-10-09 DIAGNOSIS — M7501 Adhesive capsulitis of right shoulder: Secondary | ICD-10-CM | POA: Diagnosis not present

## 2015-10-11 DIAGNOSIS — M7501 Adhesive capsulitis of right shoulder: Secondary | ICD-10-CM | POA: Diagnosis not present

## 2015-10-15 DIAGNOSIS — M7501 Adhesive capsulitis of right shoulder: Secondary | ICD-10-CM | POA: Diagnosis not present

## 2015-10-19 DIAGNOSIS — M7501 Adhesive capsulitis of right shoulder: Secondary | ICD-10-CM | POA: Diagnosis not present

## 2016-11-07 ENCOUNTER — Other Ambulatory Visit: Payer: Self-pay | Admitting: Family Medicine

## 2016-11-07 ENCOUNTER — Ambulatory Visit
Admission: RE | Admit: 2016-11-07 | Discharge: 2016-11-07 | Disposition: A | Discharge status: Home / Self Care | Payer: Medicare Other | Source: Ambulatory Visit | Attending: Family Medicine | Admitting: Family Medicine

## 2016-11-07 DIAGNOSIS — R1906 Epigastric swelling, mass or lump: Secondary | ICD-10-CM

## 2016-11-07 MED ORDER — IOPAMIDOL (ISOVUE-300) INJECTION 61%
125.0000 mL | Freq: Once | INTRAVENOUS | Status: DC | PRN
Start: 2016-11-07 — End: 2016-11-08

## 2016-12-17 ENCOUNTER — Emergency Department (HOSPITAL_BASED_OUTPATIENT_CLINIC_OR_DEPARTMENT_OTHER): Payer: Medicare Other

## 2016-12-17 ENCOUNTER — Emergency Department (HOSPITAL_BASED_OUTPATIENT_CLINIC_OR_DEPARTMENT_OTHER)
Admission: EM | Admit: 2016-12-17 | Discharge: 2016-12-18 | Disposition: A | Discharge status: Home / Self Care | Payer: Medicare Other | Attending: Emergency Medicine | Admitting: Emergency Medicine

## 2016-12-17 ENCOUNTER — Encounter (HOSPITAL_BASED_OUTPATIENT_CLINIC_OR_DEPARTMENT_OTHER): Payer: Self-pay

## 2016-12-17 DIAGNOSIS — Z79899 Other long term (current) drug therapy: Secondary | ICD-10-CM | POA: Diagnosis not present

## 2016-12-17 DIAGNOSIS — I4892 Unspecified atrial flutter: Secondary | ICD-10-CM | POA: Insufficient documentation

## 2016-12-17 DIAGNOSIS — I480 Paroxysmal atrial fibrillation: Secondary | ICD-10-CM

## 2016-12-17 DIAGNOSIS — R000 Tachycardia, unspecified: Secondary | ICD-10-CM | POA: Diagnosis present

## 2016-12-17 DIAGNOSIS — Z87891 Personal history of nicotine dependence: Secondary | ICD-10-CM | POA: Insufficient documentation

## 2016-12-17 DIAGNOSIS — Z7901 Long term (current) use of anticoagulants: Secondary | ICD-10-CM

## 2016-12-17 DIAGNOSIS — I10 Essential (primary) hypertension: Secondary | ICD-10-CM | POA: Insufficient documentation

## 2016-12-17 HISTORY — DX: Long term (current) use of anticoagulants: Z79.01

## 2016-12-17 HISTORY — PX: CARDIOVERSION: SHX1299

## 2016-12-17 HISTORY — DX: Essential (primary) hypertension: I10

## 2016-12-17 HISTORY — DX: Paroxysmal atrial fibrillation: I48.0

## 2016-12-17 LAB — CBC WITH DIFFERENTIAL/PLATELET
Basophils Absolute: 0.1 10*3/uL (ref 0.0–0.1)
Basophils Relative: 1 %
EOS ABS: 0.2 10*3/uL (ref 0.0–0.7)
EOS PCT: 2 %
HCT: 43.2 % (ref 36.0–46.0)
Hemoglobin: 14.5 g/dL (ref 12.0–15.0)
LYMPHS ABS: 2.1 10*3/uL (ref 0.7–4.0)
LYMPHS PCT: 22 %
MCH: 31.8 pg (ref 26.0–34.0)
MCHC: 33.6 g/dL (ref 30.0–36.0)
MCV: 94.7 fL (ref 78.0–100.0)
MONO ABS: 0.8 10*3/uL (ref 0.1–1.0)
MONOS PCT: 8 %
Neutro Abs: 6.7 10*3/uL (ref 1.7–7.7)
Neutrophils Relative %: 67 %
PLATELETS: 318 10*3/uL (ref 150–400)
RBC: 4.56 MIL/uL (ref 3.87–5.11)
RDW: 12.9 % (ref 11.5–15.5)
WBC: 9.8 10*3/uL (ref 4.0–10.5)

## 2016-12-17 LAB — BASIC METABOLIC PANEL
Anion gap: 8 (ref 5–15)
BUN: 23 mg/dL — AB (ref 6–20)
CHLORIDE: 101 mmol/L (ref 101–111)
CO2: 27 mmol/L (ref 22–32)
CREATININE: 1.08 mg/dL — AB (ref 0.44–1.00)
Calcium: 9.1 mg/dL (ref 8.9–10.3)
GFR calc Af Amer: 60 mL/min — ABNORMAL LOW (ref >60)
GFR calc non Af Amer: 52 mL/min — ABNORMAL LOW (ref >60)
GLUCOSE: 114 mg/dL — AB (ref 65–99)
POTASSIUM: 3.4 mmol/L — AB (ref 3.5–5.1)
SODIUM: 136 mmol/L (ref 135–145)

## 2016-12-17 LAB — TROPONIN I

## 2016-12-17 MED ORDER — ETOMIDATE 2 MG/ML IV SOLN
12.0000 mg | Freq: Once | INTRAVENOUS | Status: AC
  Administered 2016-12-18: 12 mg via INTRAVENOUS
  Filled 2016-12-17: qty 10

## 2016-12-17 MED ORDER — ASPIRIN 81 MG PO CHEW
324.0000 mg | CHEWABLE_TABLET | Freq: Once | ORAL | Status: AC
Start: 2016-12-17 — End: 2016-12-17
  Administered 2016-12-17: 324 mg via ORAL
  Filled 2016-12-17: qty 4

## 2016-12-17 MED ORDER — POTASSIUM CHLORIDE 20 MEQ/15ML (10%) PO SOLN
40.0000 meq | Freq: Once | ORAL | Status: AC
  Administered 2016-12-17: 40 meq via ORAL
  Filled 2016-12-17: qty 30

## 2016-12-17 MED ORDER — POTASSIUM CHLORIDE 10 MEQ/100ML IV SOLN: 10.0000 meq | Freq: Once | INTRAVENOUS | Status: DC

## 2016-12-17 MED ORDER — SODIUM CHLORIDE 0.9 % IV BOLUS (SEPSIS)
500.0000 mL | Freq: Once | INTRAVENOUS | Status: AC
  Administered 2016-12-17: 500 mL via INTRAVENOUS

## 2016-12-17 NOTE — ED Notes (Signed)
ED Provider at bedside. 

## 2016-12-17 NOTE — ED Notes (Signed)
Pt called out and states she is ready for discharge now. MD made aware.

## 2016-12-17 NOTE — ED Triage Notes (Signed)
C/o fast heart rate, SOB, dizziness started 645pm-denies CP-NAD-steady gait

## 2016-12-17 NOTE — ED Provider Notes (Signed)
[facility] Provider Note   CSN: [id number] Arrival date & time: 12/17/16  2019  By signing my name below, I, Talbert NanPaul Grant, attest that this documentation has been prepared under the direction and in the presence of Laurence Spatesachel Morgan Adhrit Krenz, MD. Electronically Signed: Talbert NanPaul Grant, Scribe. 12/17/16. 12:54 AM.   History   Chief Complaint Chief Complaint  Patient presents with  . Tachycardia    HPI Sherryl MangesChristine L Osika is a 71 y.o. female with h/o LBBB, dysrhythmia, HTN, SOB who presents to the Emergency Department complaining of acute onset, moderate, intermittent dizziness that is exacerbated when moving from sitting to standing that started this evening. Pt has associated SOB, burpiness, dry mouth. Pt was cooking and cleaning up. She got really hot in the kitchen. She walked up a hill, got to the top, and couldn't breath. When she stood up she became dizzy. Pt has been to Dr Verdis PrimeHenry Smith, cardiologist. Pt took baby aspirin this morning. Pt denies any recent illness, problems with laying flat, changes in weight. Pt has been compliant with medications. NKDA.    The history is provided by the patient. No language interpreter was used.    Past Medical History:  Diagnosis Date  . Dysrhythmia    LBBB, arrythmmia  . Hypertension   . Shortness of breath    on exertion due to weight    Patient Active Problem List   Diagnosis Date Noted  . Postmenopausal bleeding 09/22/2012    Past Surgical History:  Procedure Laterality Date  . APPENDECTOMY    . BREAST SURGERY    . CHOLECYSTECTOMY    . HYSTEROSCOPY W/D&C  09/22/2012   Procedure: DILATATION AND CURETTAGE /HYSTEROSCOPY;  Surgeon: Geryl RankinsEvelyn Varnado, MD;  Location: [facility];  Service: Gynecology;  Laterality: N/A;  . SHOULDER SURGERY    . VAGINAL DELIVERY  1978. 1983  . WRIST FRACTURE SURGERY      OB History    No data available       Home Medications    Prior to Admission medications   Medication Sig Start Date End Date  Taking? Authorizing Provider  progesterone (PROMETRIUM) 100 MG capsule Take 100 mg by mouth daily.   Yes Historical Provider, MD  apixaban (ELIQUIS) 5 MG TABS tablet Take 1 tablet (5 mg total) by mouth 2 (two) times daily. 12/18/16   Laurence Spatesachel Morgan Yuliza Cara, MD  calcium-vitamin D (OSCAL WITH D) 500-200 MG-UNIT per tablet Take 1 tablet by mouth daily.    Historical Provider, MD  Cholecalciferol (VITAMIN D) 2000 UNITS tablet Take 2,000 Units by mouth daily.    Historical Provider, MD  ibuprofen (ADVIL,MOTRIN) 800 MG tablet Take 1 tablet (800 mg total) by mouth once. 09/22/12   Geryl RankinsEvelyn Varnado, MD  metoprolol succinate (TOPROL-XL) 100 MG 24 hr tablet Take 100 mg by mouth daily. Take with or immediately following a meal.    Historical Provider, MD  Multiple Vitamin (MULTIVITAMIN WITH MINERALS) TABS Take 1 tablet by mouth daily.    Historical Provider, MD  valsartan-hydrochlorothiazide (DIOVAN-HCT) 80-12.5 MG per tablet Take 1 tablet by mouth daily.    Historical Provider, MD    Family History No family history on file.  Social History Social History  Substance Use Topics  . Smoking status: Former Games developermoker  . Smokeless tobacco: Never Used  . Alcohol use Yes     Comment: wine daily     Allergies   Patient has no known allergies.   Review of Systems Review of Systems  A complete  10 system review of systems was obtained and all systems are negative except as noted in the HPI and PMH.   Physical Exam Updated Vital Signs BP 150/80 (BP Location: Left Arm)   Pulse 87   Temp 98.7 F (37.1 C) (Oral)   Resp 14   Ht 5\' 6"  (1.676 m)   Wt 260 lb (117.9 kg)   SpO2 98%   BMI 41.97 kg/m   Physical Exam  Constitutional: She is oriented to person, place, and time. She appears well-developed and well-nourished. No distress.  HENT:  Head: Normocephalic and atraumatic.  Moist mucous membranes  Eyes: Conjunctivae are normal. Pupils are equal, round, and reactive to light.  Neck: Neck supple.    Cardiovascular: Regular rhythm and normal heart sounds.  Tachycardia present.   No murmur heard. Pulmonary/Chest: Effort normal and breath sounds normal.  Abdominal: Soft. Bowel sounds are normal. She exhibits no distension. There is no tenderness.  Musculoskeletal: She exhibits edema.  Trace BLE edema.   Neurological: She is alert and oriented to person, place, and time.  Fluent speech  Skin: Skin is warm and dry.  Psychiatric: Judgment normal.  Mildly anxious.  Nursing note and vitals reviewed.    ED Treatments / Results   DIAGNOSTIC STUDIES: Oxygen Saturation is 99% on room air, normal by my interpretation.    COORDINATION OF CARE: 8:52 PM Discussed treatment plan with pt at bedside and pt agreed to plan.  Labs (all labs ordered are listed, but only abnormal results are displayed) Labs Reviewed  BASIC METABOLIC PANEL - Abnormal; Notable for the following:       Result Value   Potassium 3.4 (*)    Glucose, Bld 114 (*)    BUN 23 (*)    Creatinine, Ser 1.08 (*)    GFR calc non Af Amer 52 (*)    GFR calc Af Amer 60 (*)    All other components within normal limits  CBC WITH DIFFERENTIAL/PLATELET  TROPONIN I    EKG  EKG Interpretation  Date/Time:  Thursday December 18 2016 00:21:56 EST Ventricular Rate:  85 PR Interval:    QRS Duration: 121 QT Interval:  397 QTC Calculation: 473 R Axis:   61 Text Interpretation:  Sinus rhythm Left bundle branch block Baseline wander in lead(s) V3 since previous tracing, rate converted to sinus rhythm LBBB Confirmed by Shanavia Makela MD, Rafik Koppel [phone]) on 12/18/2016 12:29:41 AM       Radiology Dg Chest Port 1 View  Result Date: 12/17/2016 CLINICAL DATA:  Tachycardia. EXAM: PORTABLE CHEST 1 VIEW COMPARISON:  09/27/2009 FINDINGS: The heart size and mediastinal contours are within normal limits. Both lungs are clear. The visualized skeletal structures are unremarkable. IMPRESSION: No active disease. Electronically Signed   By: Signa Kell M.D.   On: 12/17/2016 21:26    Procedures .Cardioversion Date/Time: 12/18/2016 12:28 AM Performed by: Laurence Spates Authorized by: Laurence Spates   Consent:    Consent obtained:  Written   Consent given by:  Patient   Risks discussed:  Cutaneous burn, induced arrhythmia and pain   Alternatives discussed:  No treatment, alternative treatment and rate-control medication Pre-procedure details:    Cardioversion basis:  Emergent   Rhythm:  Atrial flutter   Electrode placement:  Anterior-posterior Attempt one:    Cardioversion mode:  Synchronous   Waveform:  Biphasic   Shock (Joules):  150   Shock outcome:  Conversion to normal sinus rhythm Post-procedure details:    Patient status:  Alert   Patient tolerance of procedure:  Tolerated well, no immediate complications   (including critical care time)  Medications Ordered in ED Medications  potassium chloride 10 mEq in 100 mL IVPB (0 mEq Intravenous Hold 12/17/16 2156)  apixaban (ELIQUIS) tablet 5 mg (5 mg Oral Not Given 12/18/16 0053)  aspirin chewable tablet 324 mg (324 mg Oral Given 12/17/16 2053)  sodium chloride 0.9 % bolus 500 mL (0 mLs Intravenous Stopped 12/17/16 2203)  potassium chloride 20 MEQ/15ML (10%) solution 40 mEq (40 mEq Oral Given 12/17/16 2142)  etomidate (AMIDATE) injection 12 mg (12 mg Intravenous Given 12/18/16 0015)    CHA2DS2/VAS Stroke Risk Points      Not Applicable >= 2 Points: High Risk  1 - 1.99 Points: Medium Risk  0 Points: Low Risk    A final score could not be computed because of missing components.:   Change: N/A     This score determines the patient's risk of having a stroke if the  patient has atrial fibrillation.   THE PATIENT's SCORE IS 3.  CRITICAL CARE Performed by: Ambrose Finland Rochanda Harpham   Total critical care time: 40 minutes  Critical care time was exclusive of separately billable procedures and treating other patients.  Critical care was necessary to treat or  prevent imminent or life-threatening deterioration.  Critical care was time spent personally by me on the following activities: development of treatment plan with patient and/or surrogate as well as nursing, discussions with consultants, evaluation of patient's response to treatment, examination of patient, obtaining history from patient or surrogate, ordering and performing treatments and interventions, ordering and review of laboratory studies, ordering and review of radiographic studies, pulse oximetry and re-evaluation of patient's condition.   Initial Impression / Assessment and Plan / ED Course  I have reviewed the triage vital signs and the nursing notes.  Pertinent labs & imaging results that were available during my care of the patient were reviewed by me and considered in my medical decision making (see chart for details).     PT p/w With an onset of lightheadedness associated with shortness of breath and heart racing this evening. She was uncomfortable but nontoxic on exam. Heart rate was near 140. EKG appeared to be atrial flutter with rapid ventricular response. Otherwise stable vital signs. Obtained above labs and discussed with cardiologist, Dr. Santiago Glad. I appreciate his assistance. He recommended electrical cardioversion patient on the patient's onset of symptoms this evening. We reviewed her CHADSVASC score  which is 3 therefore he recommended anticoagulation. I discussed risks and benefits of anticoagulation with the patient as well as various anticoagulation options. She voiced understanding and was comfortable with initiating Eliquis. Obtained consent to perform cardioversion under procedural sedation. See procedure note for details. Successful cardioversion to sinus rhythm after one attempt. The patient will be PO challenged and ambulated prior to discharge. Have already discussed follow-up plan with Dr. Katrinka Blazing and I have extensively reviewed return precautions.  Final Clinical  Impressions(s) / ED Diagnoses   Final diagnoses:  Atrial flutter with rapid ventricular response (HCC)    New Prescriptions New Prescriptions   APIXABAN (ELIQUIS) 5 MG TABS TABLET    Take 1 tablet (5 mg total) by mouth 2 (two) times daily.   I personally performed the services described in this documentation, which was scribed in my presence. The recorded information has been reviewed and is accurate.     Laurence Spates, MD 12/18/16 [phone]

## 2016-12-18 MED ORDER — APIXABAN 5 MG PO TABS: 5.0000 mg | ORAL_TABLET | Freq: Two times a day (BID) | ORAL | 0 refills | Status: DC

## 2016-12-18 MED ORDER — APIXABAN 5 MG PO TABS
5.0000 mg | ORAL_TABLET | Freq: Once | ORAL | Status: DC
  Filled 2016-12-18: qty 1

## 2016-12-18 NOTE — ED Notes (Signed)
Pt awake, conversing and following commands. Pt given water. Husband at bedside.

## 2016-12-18 NOTE — Discharge Instructions (Signed)
Return immediately if you have any reoccurrence of your symptoms or if you have any chest pain. Follow-up with cardiology and begin taking Eliquis at home. Do not take any ibuprofen, aleve, advil, or motrin.

## 2016-12-18 NOTE — ED Notes (Signed)
ED Provider at bedside. 

## 2016-12-22 ENCOUNTER — Ambulatory Visit (INDEPENDENT_AMBULATORY_CARE_PROVIDER_SITE_OTHER): Payer: Medicare Other | Admitting: Cardiology

## 2016-12-22 ENCOUNTER — Encounter: Payer: Self-pay | Admitting: Cardiology

## 2016-12-22 DIAGNOSIS — Z0389 Encounter for observation for other suspected diseases and conditions ruled out: Secondary | ICD-10-CM

## 2016-12-22 DIAGNOSIS — I447 Left bundle-branch block, unspecified: Secondary | ICD-10-CM | POA: Diagnosis not present

## 2016-12-22 DIAGNOSIS — IMO0001 Reserved for inherently not codable concepts without codable children: Secondary | ICD-10-CM | POA: Insufficient documentation

## 2016-12-22 DIAGNOSIS — I10 Essential (primary) hypertension: Secondary | ICD-10-CM | POA: Insufficient documentation

## 2016-12-22 DIAGNOSIS — Z7901 Long term (current) use of anticoagulants: Secondary | ICD-10-CM | POA: Diagnosis not present

## 2016-12-22 DIAGNOSIS — I480 Paroxysmal atrial fibrillation: Secondary | ICD-10-CM | POA: Insufficient documentation

## 2016-12-22 DIAGNOSIS — E669 Obesity, unspecified: Secondary | ICD-10-CM | POA: Insufficient documentation

## 2016-12-22 NOTE — Progress Notes (Signed)
12/22/2016 Alexandra Rogers   06-07-1946  [id number]  Primary Physician MCNEILL,WENDY, MD Primary Cardiologist: Dr Dereck Leep  HPI:  Pleasant 71 y/o female who has been followed in the past by Dr Katrinka Blazing. Her husband was principle at [employer]. The pt was seen intialy in 2004 when she presented with syncope and LBBB. A cath then showed normal coronaries. She was also documented to have PAF but at the time was felt to be low risk and wasn't anticoagulated. She hasn't seen dr Katrinka Blazing in at least 8 years.   She presented to the ED 12/17/16 after she developed weakness and SOB while at a church function. The pt noted an "anxious feeling" in her chest that morning. Later while at church she was cooking in the kitchen and became very "hot and dizzy". When she went to walk upstairs she was significantly SOB and remained SOB during the service. She had her husband drive her to the ED after church and was found to be in atrial flutter with 2:1 conduction and LBBB. She was cardioverted to NSR and placed on Eliquis. She is in the office today for follow up. She tells me that prior to 12/17/16 she had been in her usual state of health, no recent illness except for intermittent IBS which causes frequent bowel movements. Her K+ was 3.4 in the ED. Today she is in NSR-SB on her usual dose of Toprol 100 mg daily.    Current Outpatient Prescriptions  Medication Sig Dispense Refill  . Acetaminophen (TYLENOL ARTHRITIS PAIN PO) Take 2 capsules by mouth daily.    Marland Kitchen apixaban (ELIQUIS) 5 MG TABS tablet Take 1 tablet (5 mg total) by mouth 2 (two) times daily. 60 tablet 0  . calcium-vitamin D (OSCAL WITH D) 500-200 MG-UNIT per tablet Take 1 tablet by mouth daily.    . Cholecalciferol (VITAMIN D) 2000 UNITS tablet Take 2,000 Units by mouth daily.    Marland Kitchen ibuprofen (ADVIL,MOTRIN) 800 MG tablet Take 1 tablet (800 mg total) by mouth once. 20 tablet 1  . metoprolol succinate (TOPROL-XL) 100 MG 24 hr tablet Take 100 mg by  mouth daily. Take with or immediately following a meal.    . Multiple Vitamin (MULTIVITAMIN WITH MINERALS) TABS Take 1 tablet by mouth daily.    . progesterone (PROMETRIUM) 100 MG capsule Take 100 mg by mouth daily.    . TURMERIC PO Take 1 capsule by mouth daily.    . valsartan-hydrochlorothiazide (DIOVAN-HCT) 80-12.5 MG per tablet Take 1 tablet by mouth daily.     No current facility-administered medications for this visit.     No Known Allergies   Past Medical History:  Diagnosis Date  . Chronic anticoagulation 12/17/2016   CHADs VASc=3  . Dysrhythmia    LBBB, arrythmmia  . Hypertension   . LBBB (left bundle branch block)   . Normal coronary arteries 2004  . PAF (paroxysmal atrial fibrillation) (HCC) 12/17/2016  . Shortness of breath    on exertion due to weight    Social History   Social History  . Marital status: Married    Spouse name: N/A  . Number of children: N/A  . Years of education: N/A   Occupational History  . Not on file.   Social History Main Topics  . Smoking status: Former Games developer  . Smokeless tobacco: Never Used  . Alcohol use Yes     Comment: wine daily  . Drug use: No  . Sexual activity: Not on file  Other Topics Concern  . Not on file   Social History Narrative  . No narrative on file    Family History  Problem Relation Age of Onset  . Crohn's disease Mother   . Pancreatic cancer Father    Review of Systems: General: negative for chills, fever, night sweats or weight changes.  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  All other systems reviewed and are otherwise negative except as noted above.    Blood pressure (!) 154/90, pulse (!) 54, height 5' 5.5" (1.664 m), weight 260 lb (117.9 kg).  General appearance: alert, cooperative, no distress and morbidly obese Neck: no carotid bruit and no JVD Lungs: clear to auscultation bilaterally Heart: regular rate and rhythm Abdomen: obese, non tender Extremities: venous varicosities, trace edema Pulses: 2+ and symmetric Skin: Skin color, texture, turgor normal. No rashes or lesions Neurologic: Grossly normal  EKG NSR, SB-54, LBBB  ASSESSMENT AND PLAN:   PAF (paroxysmal atrial fibrillation) (HCC) Atrial flutter with 2:1 conduction, cardioverted in ED 12/17/16  Chronic anticoagulation CHADs VASc=3 (age, sex, HTN)  Eliquis started 12/17/16  LBBB (left bundle branch block) Chronic  Normal coronary arteries Cath 2004 after she presented with syncope, LBBB  Essential hypertension Borderline control, baseline bradycardia on Toprol 100  Obesity (BMI 30-39.9) BMI- 42.6   PLAN  I suggested she continue her current medications except for Tumeric, ASA, and NSAIDs. I ordered an echo, TSH, and 2 week Holter. I am a little concerned she may have SSS with her baseline bradycardia and LBBB. If she has recurrent atrial flutter consider EP evaluation. She denies having symptoms of daytime fatigue or severe snoring but we may want to revisit sleep apnea in the future.   We discussed stopping caffine. She knows to avoid decongestants. F/U with Dr Katrinka BlazingSmith.  She is traveling to [place] on April 15th and [place] in June.   Corine ShelterLuke Sudeep Scheibel PA-C 12/22/2016 3:36 PM

## 2016-12-22 NOTE — Assessment & Plan Note (Signed)
CHADs VASc=3 (age, sex, HTN)  Eliquis started 12/17/16

## 2016-12-22 NOTE — Assessment & Plan Note (Addendum)
BMI- 42.6

## 2016-12-22 NOTE — Assessment & Plan Note (Signed)
Borderline control, baseline bradycardia on Toprol 100

## 2016-12-22 NOTE — Assessment & Plan Note (Signed)
Atrial flutter with 2:1 conduction, cardioverted in ED 12/17/16

## 2016-12-22 NOTE — Assessment & Plan Note (Signed)
Chronic. 

## 2016-12-22 NOTE — Patient Instructions (Addendum)
Medication Instructions:  STOP aspirin  Labwork: Have lab work TODAY (TSH)  Testing/Procedures: Your physician has requested that you have an echocardiogram at [facility]. Echocardiography is a painless test that uses sound waves to create images of your heart. It provides your doctor with information about the size and shape of your heart and how well your heart's chambers and valves are working. This procedure takes approximately one hour. There are no restrictions for this procedure.  Your physician has recommended that you wear a 2 week event monitor. Event monitors are medical devices that record the heart's electrical activity. Doctors most often us these monitors to diagnose arrhythmias. Arrhythmias are problems with the speed or rhythm of the heartbeat. The monitor is a small, portable device. You can wear one while you do your normal daily activities. This is usually used to diagnose what is causing palpitations/syncope (passing out).   Follow-Up: Your physician recommends that you schedule a follow-up appointment in: After testing with Dr. Katrinka BlazingSmith   Any Other Special Instructions Will Be Listed Below (If Applicable).        If you need a refill on your cardiac medications before your next appointment, please call your pharmacy.

## 2016-12-22 NOTE — Assessment & Plan Note (Signed)
Cath 2004 after she presented with syncope, LBBB

## 2016-12-23 LAB — TSH: TSH: 2.89 mIU/L

## 2017-01-12 ENCOUNTER — Other Ambulatory Visit: Payer: Self-pay

## 2017-01-12 ENCOUNTER — Ambulatory Visit (INDEPENDENT_AMBULATORY_CARE_PROVIDER_SITE_OTHER): Payer: Medicare Other

## 2017-01-12 ENCOUNTER — Ambulatory Visit (HOSPITAL_COMMUNITY): Payer: Medicare Other | Attending: Cardiovascular Disease

## 2017-01-12 ENCOUNTER — Other Ambulatory Visit: Payer: Self-pay | Admitting: Cardiology

## 2017-01-12 ENCOUNTER — Other Ambulatory Visit: Payer: Self-pay | Admitting: *Deleted

## 2017-01-12 DIAGNOSIS — R42 Dizziness and giddiness: Secondary | ICD-10-CM | POA: Diagnosis not present

## 2017-01-12 DIAGNOSIS — I480 Paroxysmal atrial fibrillation: Secondary | ICD-10-CM | POA: Insufficient documentation

## 2017-01-12 DIAGNOSIS — I071 Rheumatic tricuspid insufficiency: Secondary | ICD-10-CM | POA: Insufficient documentation

## 2017-01-12 DIAGNOSIS — R001 Bradycardia, unspecified: Secondary | ICD-10-CM

## 2017-01-12 DIAGNOSIS — R0602 Shortness of breath: Secondary | ICD-10-CM

## 2017-01-12 DIAGNOSIS — Z8679 Personal history of other diseases of the circulatory system: Secondary | ICD-10-CM

## 2017-01-12 MED ORDER — APIXABAN 5 MG PO TABS: 5.0000 mg | ORAL_TABLET | Freq: Two times a day (BID) | ORAL | 5 refills | Status: DC

## 2017-01-12 NOTE — Telephone Encounter (Signed)
WT 117.9kg (12/22/2016) Saw [name] (12/22/2016) 12/17/2016 Hgb 14.5 HCT 43.2  12/17/2016 SrCr 1.08  Spoke with pt and made an appt for her to be seen in coumadin clinic on April 26th to review her Eliquis and to obtain CBC and BMET  Spoke with pt and reviewed regarding Eliquis no products with ASA and she states had been taking Pepto Bismol when had indigestion as she has had cholecystectomy and did ask Pharmacist and she states may take TUMS and Imodium and this message given to pt Also instructed to take Eliquis q 12 hours apart Monitor for any bleeding and monitor for sign or symptom of stroke and to inform any doctor that she goes to that she is on Eliquis . To call MD if ever has injury to head and if expectorates coffee ground sputum that is old blood as well and she states understanding Started Eliquis 5mg  q 12 hours on February 21st 2018 for PAF and did send in refill for this medication

## 2017-01-12 NOTE — Addendum Note (Signed)
Addended by: Jeannine KittenWALKER, SHARON G on: 01/12/2017 04:06 PM   Modules accepted: Orders

## 2017-01-12 NOTE — Telephone Encounter (Signed)
I would refill the prescription, she will need to be followed long-term because of the recurrence of atrial arrhythmia and a chads Vasc > 1. She will need to have chronic follow-up to help with management.

## 2017-01-12 NOTE — Telephone Encounter (Signed)
Patient has a prescription for Eliquis given to her through the emergence room that is about to expire.  She has enough medication to cover her through Friday, 03/18/17.  She is in today for a  14 day cardiac event monitor [facility].  She will be leaving the country April 15,2018 thru April 25,2018.  Eliquis 5 mg, 1 tablet bid. #[address] [facility] [address], [place], [place] [phone]

## 2017-01-20 ENCOUNTER — Other Ambulatory Visit (HOSPITAL_COMMUNITY)
Admission: RE | Admit: 2017-01-20 | Discharge: 2017-01-20 | Disposition: A | Discharge status: Home / Self Care | Payer: Medicare Other | Source: Ambulatory Visit | Attending: Family Medicine | Admitting: Family Medicine

## 2017-01-20 ENCOUNTER — Other Ambulatory Visit: Payer: Self-pay | Admitting: Family Medicine

## 2017-01-20 DIAGNOSIS — Z124 Encounter for screening for malignant neoplasm of cervix: Secondary | ICD-10-CM | POA: Insufficient documentation

## 2017-01-22 LAB — CYTOLOGY - PAP
DIAGNOSIS: UNDETERMINED — AB
HPV: NOT DETECTED

## 2017-02-03 ENCOUNTER — Telehealth: Payer: Self-pay | Admitting: Cardiology

## 2017-02-03 NOTE — Telephone Encounter (Signed)
Patient calling states that she is going out of town on Sunday, and would like to make sure it would be okay to travel out of town. Patient states that she would also like to know the results from monitor. Please call to discuss,thanks.

## 2017-02-03 NOTE — Telephone Encounter (Signed)
Event monitor results imported for Dr. Katrinka Blazing to review.

## 2017-02-04 ENCOUNTER — Telehealth: Payer: Self-pay | Admitting: Cardiology

## 2017-02-04 NOTE — Telephone Encounter (Signed)
Will route to Dr. Katrinka Blazing to let him know pt wanting to know if cleared to fly based off of her monitor results, as she is going out of the country on 02/08/17.

## 2017-02-04 NOTE — Telephone Encounter (Signed)
New message  Pt call requesting to speak with RN about monitor results. Pt would like to get results before the end of the week. Pt is going out of the country and wants to be sure she is clear to fly. Please call back to discuss

## 2017-02-04 NOTE — Telephone Encounter (Signed)
Heart monitor still "in process" per EPIC

## 2017-02-05 NOTE — Telephone Encounter (Signed)
Patient made aware of results. Patient verbalizes understanding.  

## 2017-02-05 NOTE — Telephone Encounter (Signed)
Left message for patient to call back.  Will route to Dr. Katrinka Blazing to let him know patient wanting to know if she is cleared to fly based off of her monitor results. Patient is suppose to go out of the country on 02/08/17.

## 2017-02-05 NOTE — Telephone Encounter (Signed)
Follow up   Pt is calling to find out if results from heart monitor were received. She said she needs to know if she is clear to fly, because she is leaving to go out of the country on Sunday.

## 2017-02-05 NOTE — Telephone Encounter (Signed)
See monitor results. The palpitations and "flutter" are correlated with PAC/PVC's.

## 2017-02-05 NOTE — Telephone Encounter (Signed)
Left message for patient to call back  

## 2017-02-05 NOTE — Telephone Encounter (Signed)
Patient called back. Patient was informed that the message has been sent to Dr. Katrinka Blazing and we will call her as soon as we have his approval. Patient verbalized understanding.

## 2017-02-06 ENCOUNTER — Encounter: Payer: Self-pay | Admitting: Interventional Cardiology

## 2017-02-20 ENCOUNTER — Telehealth: Payer: Self-pay | Admitting: Interventional Cardiology

## 2017-02-20 NOTE — Telephone Encounter (Signed)
New message     Returning Jennifers call , she had called pt 3 times,   Got disconnected from patient

## 2017-02-20 NOTE — Telephone Encounter (Signed)
Spoke with pt and went over monitor results.  Pt verbalized understanding.  

## 2017-02-24 NOTE — Progress Notes (Signed)
Cardiology Office Note    Date:  02/25/2017   ID:  Alexandra Rogers, DOB December 30, 1945, MRN [id number]  PCP:  Gweneth Dimitri, MD  Cardiologist: Lesleigh Noe, MD   Chief Complaint  Patient presents with  . Atrial Fibrillation    History of Present Illness:  Alexandra Rogers is a 71 y.o. female the pt was seen intialy in 2004 when she presented with syncope and LBBB. A cath then showed normal coronaries. She was also documented to have PAF but at the time was felt to be low risk and wasn't anticoagulated. She hasn't seen dr Katrinka Blazing in at least 8 years. Recent emergency room visit with atrial flutter and required cardioversion in the department. She was started on Eliquis 5 mg twice daily and has had no recurrence since that time.  Alexandra Rogers is doing well. She has been on a river cruise in [place]. She's had no recurrence of fatigue and dyspnea as she had related to the recent episode of atrial flutter. Evaluation by Alexandra Shelter, PA-C, after the event included an echocardiogram which was structurally normal and 30 day monitor did not demonstrate recurrent atrial fibrillation flutter.   Past Medical History:  Diagnosis Date  . Chronic anticoagulation 12/17/2016   CHADs VASc=3  . Dysrhythmia    LBBB, arrythmmia  . Hypertension   . LBBB (left bundle branch block)   . Normal coronary arteries 2004  . PAF (paroxysmal atrial fibrillation) (HCC) 12/17/2016  . Shortness of breath    on exertion due to weight    Past Surgical History:  Procedure Laterality Date  . APPENDECTOMY    . BREAST SURGERY    . CARDIOVERSION  12/17/2016   in ED for 2:1 A flutter with LBBB  . CHOLECYSTECTOMY    . HYSTEROSCOPY W/D&C  09/22/2012   Procedure: DILATATION AND CURETTAGE /HYSTEROSCOPY;  Surgeon: Geryl Rankins, MD;  Location: [facility];  Service: Gynecology;  Laterality: N/A;  . LEFT HEART CATH  2004   Normal coronaries  . SHOULDER SURGERY    . VAGINAL DELIVERY  1978. 1983  . WRIST FRACTURE  SURGERY      Current Medications: Outpatient Medications Prior to Visit  Medication Sig Dispense Refill  . Acetaminophen (TYLENOL ARTHRITIS PAIN PO) Take 2 capsules by mouth daily.    Marland Kitchen apixaban (ELIQUIS) 5 MG TABS tablet Take 1 tablet (5 mg total) by mouth 2 (two) times daily. 60 tablet 5  . calcium-vitamin D (OSCAL WITH D) 500-200 MG-UNIT per tablet Take 1 tablet by mouth daily.    . Cholecalciferol (VITAMIN D) 2000 UNITS tablet Take 2,000 Units by mouth daily.    . metoprolol succinate (TOPROL-XL) 100 MG 24 hr tablet Take 100 mg by mouth daily. Take with or immediately following a meal.    . Multiple Vitamin (MULTIVITAMIN WITH MINERALS) TABS Take 1 tablet by mouth daily.    . progesterone (PROMETRIUM) 100 MG capsule Take 100 mg by mouth daily.    . valsartan-hydrochlorothiazide (DIOVAN-HCT) 80-12.5 MG per tablet Take 1 tablet by mouth daily.    Marland Kitchen ibuprofen (ADVIL,MOTRIN) 800 MG tablet Take 1 tablet (800 mg total) by mouth once. 20 tablet 1  . TURMERIC PO Take 1 capsule by mouth daily.     No facility-administered medications prior to visit.      Allergies:   Patient has no known allergies.   Social History   Social History  . Marital status: Married    Spouse name: N/A  . Number of  children: N/A  . Years of education: N/A   Social History Main Topics  . Smoking status: Former Games developer  . Smokeless tobacco: Never Used  . Alcohol use Yes     Comment: wine daily  . Drug use: No  . Sexual activity: Not Asked   Other Topics Concern  . None   Social History Narrative  . None     Family History:  The patient's family history includes Crohn's disease in her mother; Pancreatic cancer in her father.   ROS:   Please see the history of present illness.    Snores but denies excessive daytime sleepiness. Denies chest pain.  All other systems reviewed and are negative.   PHYSICAL EXAM:   VS:  BP 136/88 (BP Location: Left Arm)   Pulse 67   Ht  (1.702 m)   Wt 254 lb 12.8  oz (115.6 kg)   BMI 39.91 kg/m    GEN: Well nourished, well developed, in no acute distress  HEENT: normal  Neck: no JVD, carotid bruits, or masses Cardiac: RRR; no murmurs, rubs, or gallops,no edema  Respiratory:  clear to auscultation bilaterally, normal work of breathing GI: soft, nontender, nondistended, + BS MS: no deformity or atrophy  Skin: warm and dry, no rash Neuro:  Alert and Oriented x 3, Strength and sensation are intact Psych: euthymic mood, full affect  Wt Readings from Last 3 Encounters:  02/25/17 254 lb 12.8 oz (115.6 kg)  12/22/16 260 lb (117.9 kg)  12/17/16 260 lb (117.9 kg)      Studies/Labs Reviewed:   EKG:  EKG  No new study  Recent Labs: 12/17/2016: BUN 23; Creatinine, Ser 1.08; Hemoglobin 14.5; Platelets 318; Potassium 3.4; Sodium 136 12/22/2016: TSH 2.89   Lipid Panel No results found for: CHOL, TRIG, HDL, CHOLHDL, VLDL, LDLCALC, LDLDIRECT  Additional studies/ records that were reviewed today include:   ECHOCARDIOGRAM January 12, 2017 Study Conclusions  - Left ventricle: The cavity size was normal. Systolic function was   normal. The estimated ejection fraction was in the range of 55%   to 60%. Wall motion was normal; there were no regional wall   motion abnormalities. Left ventricular diastolic function   parameters were normal. - Left atrium: The atrium was mildly dilated.  Thirty-day continuous monitor March 2018: Study Highlights     Basic rhythm is normal sinus rhythm.  Heart rate ranged between 45 and 119 bpm.  Palpitations correlate with PACs and PVCs.  No atrial fibrillation.   Basic underlying rhythm is normal sinus rhythm. Moderate correlation between complaints of flutter/palpitation and PACs/PVCs. No atrial fib is noted.      ASSESSMENT:    1. PAF (paroxysmal atrial fibrillation) (HCC)   2. LBBB (left bundle branch block)   3. Essential hypertension   4. Chronic anticoagulation   5. Snoring      PLAN:  In  order of problems listed above:  1. Most recent atrial arrhythmia was atrial flutter. This remote history of atrial fibrillation. She drinks at least one glass of wine per day. She snores. I discussed the relationship between alcohol and atrial fibrillation/flutter. We also discussed the role of sleep apnea in the precipitation of atrial arrhythmias. She will undergo a sleep study. She will limit alcohol to one glass of wine per night. 2. Not addressed. 3. Blood pressure is under adequate control. 2 g sodium diet discussed. 4. We discussed indefinite use of anticoagulation therapy to decrease stroke risk from 2.2% per year to  less than 1%. CHADS VASC score is 2. 5. A sleep study as ordered  Plan clinical follow-up in 6-9 months. Sleep study will be done. Reduce alcohol intake is recommended.  Medication Adjustments/Labs and Tests Ordered: Current medicines are reviewed at length with the patient today.  Concerns regarding medicines are outlined above.  Medication changes, Labs and Tests ordered today are listed in the Patient Instructions below. Patient Instructions  Medication Instructions:  None  Labwork: None  Testing/Procedures: Your physician has recommended that you have a sleep study. This test records several body functions during sleep, including: brain activity, eye movement, oxygen and carbon dioxide blood levels, heart rate and rhythm, breathing rate and rhythm, the flow of air through your mouth and nose, snoring, body muscle movements, and chest and belly movement.   Follow-Up: Your physician wants you to follow-up in: 6-9 months with Dr. Katrinka Blazing.  You will receive a reminder letter in the mail two months in advance. If you don't receive a letter, please call our office to schedule the follow-up appointment.   Any Other Special Instructions Will Be Listed Below (If Applicable).     If you need a refill on your cardiac medications before your next appointment, please call  your pharmacy.      Signed, Lesleigh Noe, MD  02/25/2017 1:06 PM    [facility] [address] [place], [place], [place]  [address] Phone: [phone]; Fax: [phone]

## 2017-02-25 ENCOUNTER — Ambulatory Visit (INDEPENDENT_AMBULATORY_CARE_PROVIDER_SITE_OTHER): Payer: Medicare Other | Admitting: Interventional Cardiology

## 2017-02-25 ENCOUNTER — Encounter: Payer: Self-pay | Admitting: Interventional Cardiology

## 2017-02-25 VITALS — BP 136/88 | HR 67 | Ht 67.0 in | Wt 254.8 lb

## 2017-02-25 DIAGNOSIS — R0683 Snoring: Secondary | ICD-10-CM | POA: Diagnosis not present

## 2017-02-25 DIAGNOSIS — Z7901 Long term (current) use of anticoagulants: Secondary | ICD-10-CM

## 2017-02-25 DIAGNOSIS — I447 Left bundle-branch block, unspecified: Secondary | ICD-10-CM | POA: Diagnosis not present

## 2017-02-25 DIAGNOSIS — I480 Paroxysmal atrial fibrillation: Secondary | ICD-10-CM | POA: Diagnosis not present

## 2017-02-25 DIAGNOSIS — I10 Essential (primary) hypertension: Secondary | ICD-10-CM

## 2017-02-25 NOTE — Patient Instructions (Signed)
Medication Instructions:  None  Labwork: None  Testing/Procedures: Your physician has recommended that you have a sleep study. This test records several body functions during sleep, including: brain activity, eye movement, oxygen and carbon dioxide blood levels, heart rate and rhythm, breathing rate and rhythm, the flow of air through your mouth and nose, snoring, body muscle movements, and chest and belly movement.   Follow-Up: Your physician wants you to follow-up in: 6-9 months with Dr. Katrinka Blazing.  You will receive a reminder letter in the mail two months in advance. If you don't receive a letter, please call our office to schedule the follow-up appointment.   Any Other Special Instructions Will Be Listed Below (If Applicable).     If you need a refill on your cardiac medications before your next appointment, please call your pharmacy.

## 2017-02-27 ENCOUNTER — Telehealth: Payer: Self-pay | Admitting: *Deleted

## 2017-02-27 NOTE — Telephone Encounter (Addendum)
Per Dr. Katrinka BlazingSmith split night sleep study ordered  ESS=5 Patient notified of sleep study scheduled for Thursday May 07 2017. Patient understands she will receive a letter in the mail in a week or so. Patient understand to call if her letter does not reach her in a timely manner. Patient agrees with treatment plan and thanked me.

## 2017-04-20 ENCOUNTER — Other Ambulatory Visit: Payer: Self-pay | Admitting: Family Medicine

## 2017-04-20 ENCOUNTER — Other Ambulatory Visit (HOSPITAL_COMMUNITY)
Admission: RE | Admit: 2017-04-20 | Discharge: 2017-04-20 | Disposition: A | Discharge status: Home / Self Care | Payer: Medicare Other | Source: Ambulatory Visit | Attending: Family Medicine | Admitting: Family Medicine

## 2017-04-20 DIAGNOSIS — R87610 Atypical squamous cells of undetermined significance on cytologic smear of cervix (ASC-US): Secondary | ICD-10-CM | POA: Diagnosis present

## 2017-04-23 LAB — CYTOLOGY - PAP: DIAGNOSIS: NEGATIVE

## 2017-05-01 ENCOUNTER — Encounter (HOSPITAL_BASED_OUTPATIENT_CLINIC_OR_DEPARTMENT_OTHER): Payer: Medicare Other

## 2017-05-07 ENCOUNTER — Ambulatory Visit (HOSPITAL_BASED_OUTPATIENT_CLINIC_OR_DEPARTMENT_OTHER): Payer: Medicare Other | Attending: Interventional Cardiology | Admitting: Cardiology

## 2017-05-07 DIAGNOSIS — G4733 Obstructive sleep apnea (adult) (pediatric): Secondary | ICD-10-CM | POA: Diagnosis not present

## 2017-05-07 DIAGNOSIS — G4736 Sleep related hypoventilation in conditions classified elsewhere: Secondary | ICD-10-CM | POA: Diagnosis not present

## 2017-05-07 DIAGNOSIS — I471 Supraventricular tachycardia: Secondary | ICD-10-CM | POA: Diagnosis not present

## 2017-05-07 DIAGNOSIS — I4589 Other specified conduction disorders: Secondary | ICD-10-CM | POA: Diagnosis not present

## 2017-05-07 DIAGNOSIS — R0683 Snoring: Secondary | ICD-10-CM

## 2017-05-10 NOTE — Procedures (Signed)
   Patient Name: Alexandra Rogers, Alexandra Rogers Study Date: 05/07/2017 Gender: Female D.O.B: Apr 12, 1946 Age (years): 70 Referring Provider: Verdis PrimeHenry Smith Height (inches): 67 Interpreting Physician: Armanda Magicraci Turner MD, ABSM Weight (lbs): 255 RPSGT: Cherylann ParrDubili, Fred BMI: 40 MRN: [id number] Neck Size: 15.50  CLINICAL INFORMATION Sleep Study Type: NPSG Indication for sleep study: Snoring, Witnessed Apneas  SLEEP STUDY TECHNIQUE As per the AASM Manual for the Scoring of Sleep and Associated Events v2.3 (April 2016) with a hypopnea requiring 4% desaturations.  The channels recorded and monitored were frontal, central and occipital EEG, electrooculogram (EOG), submentalis EMG (chin), nasal and oral airflow, thoracic and abdominal wall motion, anterior tibialis EMG, snore microphone, electrocardiogram, and pulse oximetry.  MEDICATIONS Medications self-administered by patient taken the night of the study : N/A  SLEEP ARCHITECTURE The study was initiated at 10:52:03 PM and ended at 5:11:54 AM.  Sleep onset time was 47.6 minutes and the sleep efficiency was 61.1%. The total sleep time was 232.0 minutes.  Stage REM latency was 127.5 minutes.  The patient spent 6.25% of the night in stage N1 sleep, 84.27% in stage N2 sleep, 1.51% in stage N3 and 7.97% in REM.  Alpha intrusion was absent.  Supine sleep was 0.00%.  RESPIRATORY PARAMETERS The overall apnea/hypopnea index (AHI) was 12.9 per hour. There were 35 total apneas, including 35 obstructive, 0 central and 0 mixed apneas. There were 15 hypopneas and 1 RERAs.  The AHI during Stage REM sleep was 35.7 per hour.  AHI while supine was N/A per hour.  The mean oxygen saturation was 90.96%. The minimum SpO2 during sleep was 82.00%.  Loud snoring was noted during this study.  CARDIAC DATA The 2 lead EKG demonstrated sinus rhythm with intermittent interventricular conduction delay. The mean heart rate was 27.81 beats per minute. Other EKG findings  include: nonsustained atrial tachycardia.  LEG MOVEMENT DATA The total PLMS were 0 with a resulting PLMS index of 0.00. Associated arousal with leg movement index was 0.0 .  IMPRESSIONS - Mild obstructive sleep apnea occurred during this study (AHI = 12.9/h). - No significant central sleep apnea occurred during this study (CAI = 0.0/h). - Moderate oxygen desaturation was noted during this study (Min O2 = 82.00%). - The patient snored with Loud snoring volume. - Nonsustained atrial tachycardia and intermittent interventricular conduction delay were noted during this study. - Clinically significant periodic limb movements did not occur during sleep. No significant associated arousals.  DIAGNOSIS - Obstructive Sleep Apnea (327.23 [G47.33 ICD-10]) - Nocturnal Hypoxemia (327.26 [G47.36 ICD-10])  RECOMMENDATIONS - Therapeutic CPAP titration to determine optimal pressure required to alleviate sleep disordered breathing. - Avoid alcohol, sedatives and other CNS depressants that may worsen sleep apnea and disrupt normal sleep architecture. - Sleep hygiene should be reviewed to assess factors that may improve sleep quality. - Weight management and regular exercise should be initiated or continued if appropriate.  Armanda Magicraci Turner Diplomate, American Board of Sleep Medicine  ELECTRONICALLY SIGNED ON:  05/10/2017, 11:22 PM [facility] PH: [phone]   FX: [phone] ACCREDITED BY THE AMERICAN ACADEMY OF SLEEP MEDICINE

## 2017-05-11 ENCOUNTER — Telehealth: Payer: Self-pay | Admitting: *Deleted

## 2017-05-11 DIAGNOSIS — G4733 Obstructive sleep apnea (adult) (pediatric): Secondary | ICD-10-CM

## 2017-05-11 NOTE — Telephone Encounter (Signed)
Informed patient of sleep study results and patient understanding was verbalized. Patient understands Dr Mayford Knifeurner recommends a CPAP titration. Patient understands [facility]. Patient understands she will receive a sleep packet in a week or so. Patient understands to call if she does not receive the sleep packet in a timely manner. Patient agrees with treatment and thanked me for call

## 2017-05-11 NOTE — Telephone Encounter (Signed)
-----   Message from Quintella Reichertraci R Turner, MD sent at 05/10/2017 11:25 PM EDT ----- Please let patient know that they have sleep apnea and recommend CPAP titration. Please set up titration in the sleep lab.

## 2017-05-11 NOTE — Telephone Encounter (Deleted)
-----   Message from Traci R Turner, MD sent at 05/10/2017 11:25 PM EDT ----- Please let patient know that they have sleep apnea and recommend CPAP titration. Please set up titration in the sleep lab. 

## 2017-05-12 ENCOUNTER — Encounter: Payer: Self-pay | Admitting: *Deleted

## 2017-05-12 NOTE — Telephone Encounter (Signed)
Patient's CPAP titration is scheduled for July 09 2017. Patient agrees with treatment plan.

## 2017-07-08 ENCOUNTER — Encounter (HOSPITAL_BASED_OUTPATIENT_CLINIC_OR_DEPARTMENT_OTHER): Payer: Medicare Other

## 2017-07-09 ENCOUNTER — Encounter (HOSPITAL_BASED_OUTPATIENT_CLINIC_OR_DEPARTMENT_OTHER): Payer: Medicare Other

## 2017-08-03 ENCOUNTER — Encounter: Payer: Self-pay | Admitting: *Deleted

## 2017-08-11 ENCOUNTER — Other Ambulatory Visit: Payer: Self-pay | Admitting: *Deleted

## 2017-08-11 MED ORDER — APIXABAN 5 MG PO TABS: 5.0000 mg | ORAL_TABLET | Freq: Two times a day (BID) | ORAL | 1 refills | Status: DC

## 2017-08-11 NOTE — Telephone Encounter (Signed)
Patient requested a ninety day rx. 

## 2017-08-11 NOTE — Telephone Encounter (Signed)
This encounter was created in error - please disregard.

## 2017-08-12 ENCOUNTER — Other Ambulatory Visit: Payer: Self-pay | Admitting: Family Medicine

## 2017-08-12 DIAGNOSIS — Z1231 Encounter for screening mammogram for malignant neoplasm of breast: Secondary | ICD-10-CM

## 2017-08-17 ENCOUNTER — Encounter: Payer: Self-pay | Admitting: *Deleted

## 2017-08-17 NOTE — Telephone Encounter (Signed)
Please do mask fit at time of her sleep study
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
## 2017-08-18 NOTE — Telephone Encounter (Signed)
Reached out to the sleep lab (Kia) and she states she will add a mask fit to the patients appointment.

## 2017-08-26 ENCOUNTER — Other Ambulatory Visit: Payer: Self-pay | Admitting: Family Medicine

## 2017-08-26 DIAGNOSIS — Z1231 Encounter for screening mammogram for malignant neoplasm of breast: Secondary | ICD-10-CM

## 2017-09-28 ENCOUNTER — Ambulatory Visit
Admission: RE | Admit: 2017-09-28 | Discharge: 2017-09-28 | Disposition: A | Discharge status: Home / Self Care | Payer: Medicare Other | Source: Ambulatory Visit | Attending: Family Medicine | Admitting: Family Medicine

## 2017-09-28 ENCOUNTER — Encounter (INDEPENDENT_AMBULATORY_CARE_PROVIDER_SITE_OTHER): Payer: Self-pay

## 2017-09-28 DIAGNOSIS — Z1231 Encounter for screening mammogram for malignant neoplasm of breast: Secondary | ICD-10-CM

## 2017-10-01 ENCOUNTER — Encounter (HOSPITAL_BASED_OUTPATIENT_CLINIC_OR_DEPARTMENT_OTHER): Payer: Medicare Other

## 2017-10-02 ENCOUNTER — Encounter (INDEPENDENT_AMBULATORY_CARE_PROVIDER_SITE_OTHER): Payer: Self-pay

## 2017-10-02 ENCOUNTER — Ambulatory Visit (INDEPENDENT_AMBULATORY_CARE_PROVIDER_SITE_OTHER): Payer: Medicare Other | Admitting: Interventional Cardiology

## 2017-10-02 ENCOUNTER — Encounter: Payer: Self-pay | Admitting: Interventional Cardiology

## 2017-10-02 VITALS — BP 142/86 | HR 66 | Ht 66.0 in | Wt 269.2 lb

## 2017-10-02 DIAGNOSIS — I10 Essential (primary) hypertension: Secondary | ICD-10-CM

## 2017-10-02 DIAGNOSIS — I447 Left bundle-branch block, unspecified: Secondary | ICD-10-CM

## 2017-10-02 DIAGNOSIS — Z7901 Long term (current) use of anticoagulants: Secondary | ICD-10-CM

## 2017-10-02 DIAGNOSIS — I480 Paroxysmal atrial fibrillation: Secondary | ICD-10-CM

## 2017-10-02 DIAGNOSIS — G4730 Sleep apnea, unspecified: Secondary | ICD-10-CM | POA: Diagnosis not present

## 2017-10-02 NOTE — Patient Instructions (Signed)
Medication Instructions:  Your physician recommends that you continue on your current medications as directed. Please refer to the Current Medication list given to you today.  Labwork: None  Testing/Procedures: None  Follow-Up: Your physician wants you to follow-up in: 9-12 months with Dr. Smith.  You will receive a reminder letter in the mail two months in advance. If you don't receive a letter, please call our office to schedule the follow-up appointment.   Any Other Special Instructions Will Be Listed Below (If Applicable).     If you need a refill on your cardiac medications before your next appointment, please call your pharmacy.   

## 2017-10-02 NOTE — Progress Notes (Signed)
Cardiology Office Note    Date:  10/02/2017   ID:  Alexandra Rogers, DOB [birth year], MRN [id number]  PCP:  Gweneth DimitriMcNeill, Wendy, MD  Cardiologist: Lesleigh NoeHenry W Smith III, MD   Chief Complaint  Patient presents with  . Coronary Artery Disease    History of Present Illness:  Alexandra Rogers is a 71 y.o. female remote history of syncope and LBBB, followed by cath revealing normal coronaries, PAF, with relatively recent cardioversion due to persistent atrial flutter.  On chronic anticoagulation therapy with Eliquis 5 mg twice daily. but at the time was felt to be low risk and wasn't anticoagulated. She hasn't seen dr Katrinka BlazingSmith in at least 8 years.   She has been tolerating anticoagulation therapy without bleeding or mishap.  A strange episode occurred in October where she became suddenly diaphoretic, weak, had elevated blood pressure, heightened heart rate.  The episode lasted less than 15 minutes.  No chest discomfort or dyspnea.  She did not consider whether this could have been an episode of atrial arrhythmia.  It was not reported and has not recurred since that time.  She has undergone workup for sleep apnea.  Felt to have mild sleep apnea and is awaiting a CPAP titration.  Being managed by Dr. Mayford Knifeurner.  Past Medical History:  Diagnosis Date  . Chronic anticoagulation 12/17/2016   CHADs VASc=3  . Dysrhythmia    LBBB, arrythmmia  . Hypertension   . LBBB (left bundle branch block)   . Normal coronary arteries 2004  . PAF (paroxysmal atrial fibrillation) (HCC) 12/17/2016  . Shortness of breath    on exertion due to weight    Past Surgical History:  Procedure Laterality Date  . APPENDECTOMY    . BREAST EXCISIONAL BIOPSY Right   . BREAST SURGERY    . CARDIOVERSION  12/17/2016   in ED for 2:1 A flutter with LBBB  . CHOLECYSTECTOMY    . HYSTEROSCOPY W/D&C  09/22/2012   Procedure: DILATATION AND CURETTAGE /HYSTEROSCOPY;  Surgeon: Geryl RankinsEvelyn Varnado, MD;  Location: [facility];  Service:  Gynecology;  Laterality: N/A;  . LEFT HEART CATH  2004   Normal coronaries  . SHOULDER SURGERY    . VAGINAL DELIVERY  1978. 1983  . WRIST FRACTURE SURGERY      Current Medications: Outpatient Medications Prior to Visit  Medication Sig Dispense Refill  . Acetaminophen (TYLENOL ARTHRITIS PAIN PO) Take 2 capsules by mouth daily.    Marland Kitchen. apixaban (ELIQUIS) 5 MG TABS tablet Take 1 tablet (5 mg total) by mouth 2 (two) times daily. 180 tablet 1  . calcium-vitamin D (OSCAL WITH D) 500-200 MG-UNIT per tablet Take 1 tablet by mouth daily.    . Cholecalciferol (VITAMIN D) 2000 UNITS tablet Take 2,000 Units by mouth daily.    . metoprolol succinate (TOPROL-XL) 100 MG 24 hr tablet Take 100 mg by mouth daily. Take with or immediately following a meal.    . Multiple Vitamin (MULTIVITAMIN WITH MINERALS) TABS Take 1 tablet by mouth daily.    . naftifine (NAFTIN) 1 % cream Apply 1 application topically daily as needed. (ring worm)  1  . progesterone (PROMETRIUM) 100 MG capsule Take 100 mg by mouth daily.    . valsartan-hydrochlorothiazide (DIOVAN-HCT) 80-12.5 MG per tablet Take 1 tablet by mouth daily.     No facility-administered medications prior to visit.      Allergies:   Patient has no known allergies.   Social History   Socioeconomic History  . Marital  status: Married    Spouse name: None  . Number of children: None  . Years of education: None  . Highest education level: None  Social Needs  . Financial resource strain: None  . Food insecurity - worry: None  . Food insecurity - inability: None  . Transportation needs - medical: None  . Transportation needs - non-medical: None  Occupational History  . None  Tobacco Use  . Smoking status: Former Games developermoker  . Smokeless tobacco: Never Used  Substance and Sexual Activity  . Alcohol use: Yes    Comment: wine daily  . Drug use: No  . Sexual activity: None  Other Topics Concern  . None  Social History Narrative  . None     Family  History:  The patient's family history includes Crohn's disease in her mother; Pancreatic cancer in her father.   ROS:   Please see the history of present illness.    Recurring diarrhea, snoring, excessive sweating and fatigue.  Limited exertional activity because of severe bilateral knee discomfort. All other systems reviewed and are negative.   PHYSICAL EXAM:   VS:  BP (!) 142/86   Pulse 66   Ht 5\' 6"  (1.676 m)   Wt 269 lb 3.2 oz (122.1 kg)   BMI 43.45 kg/m    GEN: Well nourished, well developed, in no acute distress.  Significant obesity. HEENT: normal  Neck: no JVD, carotid bruits, or masses Cardiac: RRR; no murmurs, rubs, or gallops,no edema  Respiratory:  clear to auscultation bilaterally, normal work of breathing GI: soft, nontender, nondistended, + BS MS: no deformity or atrophy  Skin: warm and dry, no rash Neuro:  Alert and Oriented x 3, Strength and sensation are intact Psych: euthymic mood, full affect  Wt Readings from Last 3 Encounters:  10/02/17 269 lb 3.2 oz (122.1 kg)  05/07/17 255 lb (115.7 kg)  02/25/17 254 lb 12.8 oz (115.6 kg)      Studies/Labs Reviewed:   EKG:  EKG  Not performed.  Recent Labs: 12/17/2016: BUN 23; Creatinine, Ser 1.08; Hemoglobin 14.5; Platelets 318; Potassium 3.4; Sodium 136 12/22/2016: TSH 2.89   Lipid Panel No results found for: CHOL, TRIG, HDL, CHOLHDL, VLDL, LDLCALC, LDLDIRECT  Additional studies/ records that were reviewed today include:  Abnormal sleep study and awaiting CPAP titration.    ASSESSMENT:    1. PAF (paroxysmal atrial fibrillation) (HCC)   2. LBBB (left bundle branch block)   3. Essential hypertension   4. Chronic anticoagulation   5. Sleep apnea, unspecified type      PLAN:  In order of problems listed above:  1. History of atrial flutter requiring cardioversion.  History of paroxysmal atrial fibrillation.  Chads Vasc score greater 3.  Chronic lifelong anticoagulation.  She is currently on Eliquis 5  mg twice daily. 2. Not addressed 3. Adequate blood pressure control.  Exercise and weight loss are encouraged. 4. Creatinine and hemoglobin should be monitored twice yearly.  Last creatinine 0.86 in October hemoglobin was 14.7. 5. Complete sleep study and encourage compliance with prescribed therapy.  Clinical follow-up in 9-12 months.  Continue current medical regimen.  No outstanding issues.  Encouraged to call if blood, head trauma, or other complaints.    Medication Adjustments/Labs and Tests Ordered: Current medicines are reviewed at length with the patient today.  Concerns regarding medicines are outlined above.  Medication changes, Labs and Tests ordered today are listed in the Patient Instructions below. Patient Instructions  Medication Instructions:  Your physician  recommends that you continue on your current medications as directed. Please refer to the Current Medication list given to you today.   Labwork: None  Testing/Procedures: None  Follow-Up: Your physician wants you to follow-up in: 9-12 months with Dr. Katrinka Blazing.  You will receive a reminder letter in the mail two months in advance. If you don't receive a letter, please call our office to schedule the follow-up appointment.   Any Other Special Instructions Will Be Listed Below (If Applicable).     If you need a refill on your cardiac medications before your next appointment, please call your pharmacy.      Signed, Lesleigh Noe, MD  10/02/2017 9:08 AM    [facility] [address] [place], [place], [place]  [address] Phone: [phone]; Fax: [phone]

## 2017-10-09 ENCOUNTER — Encounter (HOSPITAL_BASED_OUTPATIENT_CLINIC_OR_DEPARTMENT_OTHER): Payer: Medicare Other

## 2018-02-02 ENCOUNTER — Telehealth: Payer: Self-pay

## 2018-02-02 NOTE — Telephone Encounter (Signed)
   [facility] Pre-operative Risk Assessment    Request for surgical clearance:  1. What type of surgery is being performed? endoscopy   2. When is this surgery scheduled? 02/05/2018  3. What type of clearance is required (medical clearance vs. Pharmacy clearance to hold med vs. Both)? both  4. Are there any medications that need to be held prior to surgery and how long?eliquis   5. Practice name and name of physician performing surgery? [facility]  Dr. Michail Sermon   6. What is your office phone and fax number? [phone]  f- [phone]   7. Anesthesia type (None, local, MAC, general) ? Gwyneth Sprout  Harrie Cazarez 02/02/2018, 11:31 AM  _________________________________________________________________   (provider comments below)

## 2018-02-02 NOTE — Telephone Encounter (Signed)
Clearance faxed back via epic to number provided.

## 2018-02-02 NOTE — Telephone Encounter (Signed)
Patient with diagnosis of Afib on Eliquis for anticoagulation.    Procedure: endoscopy Date of procedure: 02/05/18  CHADS2-VASc score of  4 (CHF, HTN, AGE, DM2, stroke/tia x 2, CAD, AGE, female)  CrCl 2895ml/min  Per office protocol, patient can hold Eliquis for 24 hours prior to procedure.

## 2018-02-03 NOTE — Telephone Encounter (Signed)
   Primary Cardiologist: Lesleigh NoeHenry W Smith III, MD  Chart reviewed as part of pre-operative protocol coverage.  The patient is a 72 year old female with a history of paroxysmal atrial fibrillation/flutter.  Echocardiogram in 2018 demonstrated normal LV function.  She had a cardiac catheterization in 2004 that demonstrated normal coronary arteries.  She was last seen by Dr. Katrinka BlazingSmith in December 2018.  She was contacted today by telephone.  Since last seen, she has been doing well without chest pain, shortness of breath, syncope.  Our pharmacist has reviewed her case and made recommendations regarding timing of holding Eliquis for her procedure.  Preop callback staff: Letter has been faxed to requesting surgeon.  Please contact office to ensure it was received.    Tereso NewcomerScott Kryssa Risenhoover, PA-C 02/03/2018, 2:29 PM

## 2018-02-03 NOTE — Telephone Encounter (Signed)
I s/w [facility] to confirm if clearance letter was recv'd today. I was told they could not see the letter. I will fax over a hard copy to # [phone] [facility] to Dr. Bosie ClosSchooler.

## 2018-02-15 ENCOUNTER — Other Ambulatory Visit: Payer: Self-pay | Admitting: Interventional Cardiology

## 2018-02-15 NOTE — Telephone Encounter (Signed)
Pt is a 72 yr old female that saw Dr Katrinka BlazingSmith on 10/02/2017. Her weight was 122.1 Kg at that visit. [facility]  [facility]. Will refill Eliquis 5mg  BID.

## 2018-07-22 ENCOUNTER — Encounter: Payer: Self-pay | Admitting: Interventional Cardiology

## 2018-08-10 ENCOUNTER — Ambulatory Visit: Payer: Medicare Other | Admitting: Interventional Cardiology

## 2018-08-27 ENCOUNTER — Other Ambulatory Visit: Payer: Self-pay | Admitting: Interventional Cardiology

## 2018-09-19 NOTE — Progress Notes (Signed)
Cardiology Office Note:    Date:  09/20/2018   ID:  Alexandra Rogers, DOB 1946/07/09, MRN [id number]  PCP:  Gweneth DimitriMcNeill, Wendy, MD  Cardiologist:  Lesleigh NoeHenry W Smith III, MD   Referring MD: Gweneth DimitriMcNeill, Wendy, MD   Chief Complaint  Patient presents with  . Atrial Fibrillation    History of Present Illness:    Alexandra Rogers is a 72 y.o. female with a hx of remote history of syncope, LBBB,  normal coronaries, PAF (with prior cardioversion due to persistent atrial flutter) CHADS VASC > 2, and chronic anticoagulation therapy with Eliquis 5 mg twice daily.  She is doing well.  No prolonged episodes of palpitations.  She denies orthopnea, PND, and medication side effects.  No blood in the urine or stool.  Not able to exercise because of bilateral knee discomfort/arthritis.  She denies chest pain, and exertional dyspnea.  She has not had syncope.  She denies neurological complaints.  States that occasionally, emotional circumstances because some irregularity in her heartbeat.   Past Medical History:  Diagnosis Date  . Chronic anticoagulation 12/17/2016   CHADs VASc=3  . Dysrhythmia    LBBB, arrythmmia  . Hypertension   . LBBB (left bundle branch block)   . Normal coronary arteries 2004  . PAF (paroxysmal atrial fibrillation) (HCC) 12/17/2016  . Shortness of breath    on exertion due to weight    Past Surgical History:  Procedure Laterality Date  . APPENDECTOMY    . BREAST EXCISIONAL BIOPSY Right   . BREAST SURGERY    . CARDIOVERSION  12/17/2016   in ED for 2:1 A flutter with LBBB  . CHOLECYSTECTOMY    . HYSTEROSCOPY W/D&C  09/22/2012   Procedure: DILATATION AND CURETTAGE /HYSTEROSCOPY;  Surgeon: Geryl RankinsEvelyn Varnado, MD;  Location: [facility];  Service: Gynecology;  Laterality: N/A;  . LEFT HEART CATH  2004   Normal coronaries  . SHOULDER SURGERY    . VAGINAL DELIVERY  1978. 1983  . WRIST FRACTURE SURGERY      Current Medications: Current Meds  Medication Sig  . Acetaminophen  (TYLENOL ARTHRITIS PAIN PO) Take 2 capsules by mouth daily.  . calcium-vitamin D (OSCAL WITH D) 500-200 MG-UNIT per tablet Take 1 tablet by mouth daily.  . Cholecalciferol (VITAMIN D) 2000 UNITS tablet Take 2,000 Units by mouth daily.  Marland Kitchen. ELIQUIS 5 MG TABS tablet TAKE 1 TABLET(5 MG) BY MOUTH TWICE DAILY  . metoprolol succinate (TOPROL-XL) 100 MG 24 hr tablet Take 100 mg by mouth daily. Take with or immediately following a meal.  . Multiple Vitamin (MULTIVITAMIN WITH MINERALS) TABS Take 1 tablet by mouth daily.  . naftifine (NAFTIN) 1 % cream Apply 1 application topically daily as needed. (ring worm)  . progesterone (PROMETRIUM) 100 MG capsule Take 100 mg by mouth daily.  . valsartan-hydrochlorothiazide (DIOVAN-HCT) 80-12.5 MG per tablet Take 1 tablet by mouth daily.     Allergies:   Patient has no known allergies.   Social History   Socioeconomic History  . Marital status: Married    Spouse name: Not on file  . Number of children: Not on file  . Years of education: Not on file  . Highest education level: Not on file  Occupational History  . Not on file  Social Needs  . Financial resource strain: Not on file  . Food insecurity:    Worry: Not on file    Inability: Not on file  . Transportation needs:    Medical: Not on  file    Non-medical: Not on file  Tobacco Use  . Smoking status: Former Games developer  . Smokeless tobacco: Never Used  Substance and Sexual Activity  . Alcohol use: Yes    Comment: wine daily  . Drug use: No  . Sexual activity: Not on file  Lifestyle  . Physical activity:    Days per week: Not on file    Minutes per session: Not on file  . Stress: Not on file  Relationships  . Social connections:    Talks on phone: Not on file    Gets together: Not on file    Attends religious service: Not on file    Active member of club or organization: Not on file    Attends meetings of clubs or organizations: Not on file    Relationship status: Not on file  Other Topics  Concern  . Not on file  Social History Narrative  . Not on file     Family History: The patient's family history includes Crohn's disease in her mother; Pancreatic cancer in her father.  ROS:   Please see the history of present illness.    Cough, occasional irregular heartbeat, and bilateral knee discomfort with some difficulty with balance due to her knees.  All other systems reviewed and are negative.  EKGs/Labs/Other Studies Reviewed:    The following studies were reviewed today: No recent cardiac imaging or functional studies.  EKG:  EKG is  ordered today.  The ekg ordered today demonstrates sinus bradycardia/sinus rhythm, left axis deviation, normal PR interval, left bundle branch block with QRS duration 122 ms.  When compared to the prior tracing from February 2018, no significant change has occurred.  Recent Labs: No results found for requested labs within last 8760 hours.  Recent Lipid Panel No results found for: CHOL, TRIG, HDL, CHOLHDL, VLDL, LDLCALC, LDLDIRECT  Physical Exam:    VS:  BP 110/90   Pulse 61   Ht 5\' 6"  (1.676 m)   Wt 264 lb 12.8 oz (120.1 kg)   SpO2 98%   BMI 42.74 kg/m     Wt Readings from Last 3 Encounters:  09/20/18 264 lb 12.8 oz (120.1 kg)  10/02/17 269 lb 3.2 oz (122.1 kg)  05/07/17 255 lb (115.7 kg)     GEN: Obese.  Well developed in no acute distress HEENT: Normal NECK: No JVD. LYMPHATICS: No lymphadenopathy CARDIAC: RRR, no murmur, no gallop, no edema. VASCULAR: 2+ bilateral radial and carotid pulses.  No bruits. RESPIRATORY:  Clear to auscultation without rales, wheezing or rhonchi  ABDOMEN: Soft, non-tender, non-distended, No pulsatile mass, MUSCULOSKELETAL: No deformity  SKIN: Warm and dry NEUROLOGIC:  Alert and oriented x 3 PSYCHIATRIC:  Normal affect   ASSESSMENT:    1. PAF (paroxysmal atrial fibrillation) (HCC)   2. Chronic anticoagulation   3. LBBB (left bundle branch block)   4. Essential hypertension    PLAN:     In order of problems listed above:  1. No significant recurrences of atrial fibrillation patient has been able to recommend.  Reiterated the importance compliance with anticoagulation therapy to avoid embolic 2. No bleeding.  Creatinine twice yearly about. 3. No change in duration of QRS complex. 4. Excellent blood pressure control with target 130/80 mmHg.  Overall education and awareness concerning primary/secondary risk prevention was discussed in detail: LDL less than 70, hemoglobin A1c less than 7, blood pressure target less than 130/80 mmHg, >150 minutes of moderate aerobic activity per week, avoidance of smoking,  weight control (via diet and exercise), and continued surveillance/management of/for obstructive sleep apnea.  Clinical follow-up in 1 year.    Medication Adjustments/Labs and Tests Ordered: Current medicines are reviewed at length with the patient today.  Concerns regarding medicines are outlined above.  Orders Placed This Encounter  Procedures  . EKG 12-Lead   No orders of the defined types were placed in this encounter.   Patient Instructions  Medication Instructions:  Your physician recommends that you continue on your current medications as directed. Please refer to the Current Medication list given to you today.  If you need a refill on your cardiac medications before your next appointment, please call your pharmacy.   Lab work: None ordered If you have labs (blood work) drawn today and your tests are completely normal, you will receive your results only by: Marland Kitchen MyChart Message (if you have MyChart) OR . A paper copy in the mail If you have any lab test that is abnormal or we need to change your treatment, we will call you to review the results.  Testing/Procedures: None ordered  Follow-Up: At [facility], you and your health needs are our priority.  As part of our continuing mission to provide you with exceptional heart care, we have created  designated Provider Care Teams.  These Care Teams include your primary Cardiologist (physician) and Advanced Practice Providers (APPs -  Physician Assistants and Nurse Practitioners) who all work together to provide you with the care you need, when you need it. . You will need a follow up appointment in 9-12 months. Please call our office 2 months in advance to schedule this appointment.  You may see Lesleigh Noe, MD or one of the following Advanced Practice Providers on your designated Care Team:   . Norma Fredrickson, NP . Nada Boozer, NP . Georgie Chard, NP   Any Other Special Instructions Will Be Listed Below (If Applicable).     Signed, Lesleigh Noe, MD  09/20/2018 5:06 PM    [facility]

## 2018-09-20 ENCOUNTER — Encounter: Payer: Self-pay | Admitting: Interventional Cardiology

## 2018-09-20 ENCOUNTER — Ambulatory Visit: Payer: Medicare Other | Admitting: Interventional Cardiology

## 2018-09-20 VITALS — BP 110/90 | HR 61 | Ht 66.0 in | Wt 264.8 lb

## 2018-09-20 DIAGNOSIS — Z7901 Long term (current) use of anticoagulants: Secondary | ICD-10-CM | POA: Diagnosis not present

## 2018-09-20 DIAGNOSIS — I480 Paroxysmal atrial fibrillation: Secondary | ICD-10-CM | POA: Diagnosis not present

## 2018-09-20 DIAGNOSIS — I447 Left bundle-branch block, unspecified: Secondary | ICD-10-CM

## 2018-09-20 DIAGNOSIS — I10 Essential (primary) hypertension: Secondary | ICD-10-CM

## 2018-09-20 NOTE — Patient Instructions (Signed)
Medication Instructions:  Your physician recommends that you continue on your current medications as directed. Please refer to the Current Medication list given to you today.  If you need a refill on your cardiac medications before your next appointment, please call your pharmacy.   Lab work: None ordered If you have labs (blood work) drawn today and your tests are completely normal, you will receive your results only by: . MyChart Message (if you have MyChart) OR . A paper copy in the mail If you have any lab test that is abnormal or we need to change your treatment, we will call you to review the results.  Testing/Procedures: None ordered  Follow-Up: At [facility], you and your health needs are our priority.  As part of our continuing mission to provide you with exceptional heart care, we have created designated Provider Care Teams.  These Care Teams include your primary Cardiologist (physician) and Advanced Practice Providers (APPs -  Physician Assistants and Nurse Practitioners) who all work together to provide you with the care you need, when you need it. . You will need a follow up appointment in 9-12 months.  Please call our office 2 months in advance to schedule this appointment.  You may see Henry W Smith III, MD or one of the following Advanced Practice Providers on your designated Care Team:   . Lori Gerhardt, NP . Laura Ingold, NP . Jill McDaniel, NP   Any Other Special Instructions Will Be Listed Below (If Applicable).  

## 2019-09-10 ENCOUNTER — Other Ambulatory Visit: Payer: Self-pay | Admitting: Interventional Cardiology

## 2019-09-12 NOTE — Telephone Encounter (Signed)
Eliquis 5mg  refill request received. Pt is 73yrs old, weight-120.1kg, Crea-1.05 on 09/06/2019 via  [facility] at [facility], Diagnosis-Afib, and last seen by Dr. Tamala Julian on 09/20/2018 & recall pending. Dose is appropriate based on dosing criteria. Will send in refill to requested pharmacy.

## 2019-11-28 ENCOUNTER — Ambulatory Visit: Payer: Medicare Other

## 2019-12-04 ENCOUNTER — Ambulatory Visit: Payer: Medicare Other

## 2019-12-08 ENCOUNTER — Ambulatory Visit: Payer: Medicare PPO

## 2019-12-23 ENCOUNTER — Other Ambulatory Visit: Payer: Self-pay | Admitting: Interventional Cardiology

## 2019-12-23 MED ORDER — APIXABAN 5 MG PO TABS: ORAL_TABLET | ORAL | 0 refills | Status: DC

## 2019-12-23 NOTE — Telephone Encounter (Signed)
*  STAT* If patient is at the pharmacy, call can be transferred to refill team.   1. Which medications need to be refilled? (please list name of each medication and dose if known)  apixaban (ELIQUIS) 5 MG TABS tablet  2. Which pharmacy/location (including street and city if local pharmacy) is medication to be sent to? [id number] - [place], [place] - [address]  3. Do they need a 30 day or 90 day supply? 90 day    Patient has appointment scheduled for 02/07/20.

## 2019-12-23 NOTE — Telephone Encounter (Signed)
Patient overdue but has appointment in April. Will send in 1 90 day supply

## 2020-02-06 NOTE — Progress Notes (Signed)
Cardiology Office Note:    Date:  02/07/2020   ID:  [name], DOB 22-Nov-1945, MRN [id number]  PCP:  Gweneth Dimitri, MD  Cardiologist:  Lesleigh Noe, MD   Referring MD: Gweneth Dimitri, MD   Chief Complaint  Patient presents with  . Advice Only    Completion of sleep apnea evaluation  . Atrial Fibrillation  . Follow-up    Systolic murmur    History of Present Illness:    Alexandra Rogers is a 74 y.o. female with a hx of syncope, LBBB, normal coronaries,PAF (with prior cardioversion due to persistent atrial flutter) CHADS VASC > 2, obstructive sleep apnea but not on CPAP (never had a titration study) and chronic anticoagulation therapy with Eliquis 5 mg twice daily.  Gaining weight.  Physically and active.  Noticing right greater than left lower extremity edema.  Has varicose veins.  Denies orthopnea and PND.  Does not sleep well.  Relative to atrial fibrillation, she has not had bleeding on Eliquis.  She has not had symptomatic episodes of tachycardia/palpitation.  She denies chest pain.  She denies PND.   Past Medical History:  Diagnosis Date  . Chronic anticoagulation 12/17/2016   CHADs VASc=3  . Dysrhythmia    LBBB, arrythmmia  . Hypertension   . LBBB (left bundle branch block)   . Normal coronary arteries 2004  . PAF (paroxysmal atrial fibrillation) (HCC) 12/17/2016  . Shortness of breath    on exertion due to weight    Past Surgical History:  Procedure Laterality Date  . APPENDECTOMY    . BREAST EXCISIONAL BIOPSY Right   . BREAST SURGERY    . CARDIOVERSION  12/17/2016   in ED for 2:1 A flutter with LBBB  . CHOLECYSTECTOMY    . HYSTEROSCOPY WITH D & C  09/22/2012   Procedure: DILATATION AND CURETTAGE /HYSTEROSCOPY;  Surgeon: Geryl Rankins, MD;  Location: [facility];  Service: Gynecology;  Laterality: N/A;  . LEFT HEART CATH  2004   Normal coronaries  . SHOULDER SURGERY    . VAGINAL DELIVERY  1978. 1983  . WRIST FRACTURE SURGERY       Current Medications: Current Meds  Medication Sig  . Acetaminophen (TYLENOL ARTHRITIS PAIN PO) Take 2 capsules by mouth daily.  Marland Kitchen apixaban (ELIQUIS) 5 MG TABS tablet Take 1 tablet by mouth twice a day. Please call the office to schedule an appt.  . calcium-vitamin D (OSCAL WITH D) 500-200 MG-UNIT per tablet Take 1 tablet by mouth daily.  . Cholecalciferol (VITAMIN D) 2000 UNITS tablet Take 2,000 Units by mouth daily.  . metoprolol succinate (TOPROL-XL) 100 MG 24 hr tablet Take 100 mg by mouth daily. Take with or immediately following a meal.  . Multiple Vitamin (MULTIVITAMIN WITH MINERALS) TABS Take 1 tablet by mouth daily.  . naftifine (NAFTIN) 1 % cream Apply 1 application topically daily as needed. (ring worm)  . progesterone (PROMETRIUM) 100 MG capsule Take 100 mg by mouth daily.  . valsartan-hydrochlorothiazide (DIOVAN-HCT) 320-12.5 MG tablet Take 1 tablet by mouth daily.  . [DISCONTINUED] valsartan-hydrochlorothiazide (DIOVAN-HCT) 80-12.5 MG per tablet Take 1 tablet by mouth daily.     Allergies:   Patient has no known allergies.   Social History   Socioeconomic History  . Marital status: Married    Spouse name: Not on file  . Number of children: Not on file  . Years of education: Not on file  . Highest education level: Not on file  Occupational History  .  Not on file  Tobacco Use  . Smoking status: Former Games developer  . Smokeless tobacco: Never Used  Substance and Sexual Activity  . Alcohol use: Yes    Comment: wine daily  . Drug use: No  . Sexual activity: Not on file  Other Topics Concern  . Not on file  Social History Narrative  . Not on file   Social Determinants of Health   Financial Resource Strain:   . Difficulty of Paying Living Expenses:   Food Insecurity:   . Worried About Programme researcher, broadcasting/film/video in the Last Year:   . Barista in the Last Year:   Transportation Needs:   . Freight forwarder (Medical):   Marland Kitchen Lack of Transportation (Non-Medical):    Physical Activity:   . Days of Exercise per Week:   . Minutes of Exercise per Session:   Stress:   . Feeling of Stress :   Social Connections:   . Frequency of Communication with Friends and Family:   . Frequency of Social Gatherings with Friends and Family:   . Attends Religious Services:   . Active Member of Clubs or Organizations:   . Attends Banker Meetings:   Marland Kitchen Marital Status:      Family History: The patient's family history includes Crohn's disease in her mother; Pancreatic cancer in her father.  ROS:   Please see the history of present illness.    Difficulty sleeping.  All other systems reviewed and are negative.  EKGs/Labs/Other Studies Reviewed:    The following studies were reviewed today: Last 2D Doppler echocardiogram 2018.  No explanation for heart murmur.  EKG:  EKG sinus rhythm, left bundle branch block, QRS duration 122 ms, when compared to prior tracings, no changes noted.  Compared to November 2019.  Recent Labs: No results found for requested labs within last 8760 hours.  Recent Lipid Panel No results found for: CHOL, TRIG, HDL, CHOLHDL, VLDL, LDLCALC, LDLDIRECT  Physical Exam:    VS:  BP (!) 158/92   Pulse (!) 57   Ht 5\' 6"  (1.676 m)   Wt 284 lb 1.9 oz (128.9 kg)   SpO2 98%   BMI 45.86 kg/m     Wt Readings from Last 3 Encounters:  02/07/20 284 lb 1.9 oz (128.9 kg)  09/20/18 264 lb 12.8 oz (120.1 kg)  10/02/17 269 lb 3.2 oz (122.1 kg)     GEN: Morbid obesity.  20 pound weight gain since 2019.  No acute distress HEENT: Normal NECK: No JVD. LYMPHATICS: No lymphadenopathy CARDIAC: 2/6 right upper sternal border systolic murmur.  RRR without murmur, gallop, and right greater than left ankle edema with skin lichenification on the right.  Varicose veins also obvious.   VASCULAR:  Normal Pulses. No bruits. RESPIRATORY:  Clear to auscultation without rales, wheezing or rhonchi  ABDOMEN: Soft, non-tender, non-distended, No pulsatile  mass, MUSCULOSKELETAL: No deformity  SKIN: Warm and dry NEUROLOGIC:  Alert and oriented x 3 PSYCHIATRIC:  Normal affect   ASSESSMENT:    1. PAF (paroxysmal atrial fibrillation) (HCC)   2. Chronic anticoagulation   3. LBBB (left bundle branch block)   4. Essential hypertension   5. Educated about COVID-19 virus infection   6. Obstructive sleep apnea   7. Morbid obesity (HCC)   8. Heart murmur, systolic    PLAN:    In order of problems listed above:  1. Clinically silent.  Tolerating anticoagulation without difficulty. 2. Continue Eliquis.  Notify if  bleeding.  Needs CBC and creatinine twice yearly. 3. Unchanged. 4. Repeat blood pressure 140/88 mmHg.  Encouraged weight loss, salt restriction to less than 3 g/day, improved sleep hygiene, avoidance of alcohol. 5. COVID-19 vaccine has been received.  Social distancing recommended. 6. Consultation with Dr. Mayford Knife to plot out CPAP titration which she would prefer to be done at home and to initiate therapy.  She is now 3 years post the diagnosis and 20 pounds heavier. 7. Discussed aerobic activity, decrease caloric intake, and limiting alcohol intake. 8. 2D Doppler echocardiogram will be done to assess aortic valve and tricuspid valve.  Also will be helpful to assess LV and RV function given left bundle and history of untreated sleep apnea.   Medication Adjustments/Labs and Tests Ordered: Current medicines are reviewed at length with the patient today.  Concerns regarding medicines are outlined above.  Orders Placed This Encounter  Procedures  . EKG 12-Lead  . ECHOCARDIOGRAM COMPLETE   No orders of the defined types were placed in this encounter.   Patient Instructions  Medication Instructions:  Your physician recommends that you continue on your current medications as directed. Please refer to the Current Medication list given to you today.  *If you need a refill on your cardiac medications before your next appointment, please  call your pharmacy*   Lab Work: None  If you have labs (blood work) drawn today and your tests are completely normal, you will receive your results only by: Marland Kitchen MyChart Message (if you have MyChart) OR . A paper copy in the mail If you have any lab test that is abnormal or we need to change your treatment, we will call you to review the results.   Testing/Procedures: Your physician has requested that you have an echocardiogram. Echocardiography is a painless test that uses sound waves to create images of your heart. It provides your doctor with information about the size and shape of your heart and how well your heart's chambers and valves are working. This procedure takes approximately one hour. There are no restrictions for this procedure.    Follow-Up:  Your physician recommends that you schedule a follow-up appointment with Dr. Mayford Knife to follow up on your sleep study.   At [facility], you and your health needs are our priority.  As part of our continuing mission to provide you with exceptional heart care, we have created designated Provider Care Teams.  These Care Teams include your primary Cardiologist (physician) and Advanced Practice Providers (APPs -  Physician Assistants and Nurse Practitioners) who all work together to provide you with the care you need, when you need it.  We recommend signing up for the patient portal called "MyChart".  Sign up information is provided on this After Visit Summary.  MyChart is used to connect with patients for Virtual Visits (Telemedicine).  Patients are able to view lab/test results, encounter notes, upcoming appointments, etc.  Non-urgent messages can be sent to your provider as well.   To learn more about what you can do with MyChart, go to [email or web address].    Your next appointment:   12 month(s)  The format for your next appointment:   In Person  Provider:   You may see Lesleigh Noe, MD or one of the following Advanced  Practice Providers on your designated Care Team:    Norma Fredrickson, NP  Nada Boozer, NP  Georgie Chard, NP    Other Instructions      Signed, Lyn Records III,  MD  02/07/2020 10:16 AM    [place]

## 2020-02-07 ENCOUNTER — Other Ambulatory Visit: Payer: Self-pay

## 2020-02-07 ENCOUNTER — Encounter: Payer: Self-pay | Admitting: Interventional Cardiology

## 2020-02-07 ENCOUNTER — Ambulatory Visit: Payer: Medicare PPO | Admitting: Interventional Cardiology

## 2020-02-07 VITALS — BP 158/92 | HR 57 | Ht 66.0 in | Wt 284.1 lb

## 2020-02-07 DIAGNOSIS — Z7901 Long term (current) use of anticoagulants: Secondary | ICD-10-CM

## 2020-02-07 DIAGNOSIS — I480 Paroxysmal atrial fibrillation: Secondary | ICD-10-CM | POA: Diagnosis not present

## 2020-02-07 DIAGNOSIS — I447 Left bundle-branch block, unspecified: Secondary | ICD-10-CM | POA: Diagnosis not present

## 2020-02-07 DIAGNOSIS — I10 Essential (primary) hypertension: Secondary | ICD-10-CM

## 2020-02-07 DIAGNOSIS — Z7189 Other specified counseling: Secondary | ICD-10-CM

## 2020-02-07 DIAGNOSIS — R011 Cardiac murmur, unspecified: Secondary | ICD-10-CM

## 2020-02-07 DIAGNOSIS — G4733 Obstructive sleep apnea (adult) (pediatric): Secondary | ICD-10-CM

## 2020-02-07 NOTE — Patient Instructions (Signed)
Medication Instructions:  Your physician recommends that you continue on your current medications as directed. Please refer to the Current Medication list given to you today.  *If you need a refill on your cardiac medications before your next appointment, please call your pharmacy*   Lab Work: None  If you have labs (blood work) drawn today and your tests are completely normal, you will receive your results only by: Marland Kitchen MyChart Message (if you have MyChart) OR . A paper copy in the mail If you have any lab test that is abnormal or we need to change your treatment, we will call you to review the results.   Testing/Procedures: Your physician has requested that you have an echocardiogram. Echocardiography is a painless test that uses sound waves to create images of your heart. It provides your doctor with information about the size and shape of your heart and how well your heart's chambers and valves are working. This procedure takes approximately one hour. There are no restrictions for this procedure.    Follow-Up:  Your physician recommends that you schedule a follow-up appointment with Dr. Mayford Knife to follow up on your sleep study.   At [facility], you and your health needs are our priority.  As part of our continuing mission to provide you with exceptional heart care, we have created designated Provider Care Teams.  These Care Teams include your primary Cardiologist (physician) and Advanced Practice Providers (APPs -  Physician Assistants and Nurse Practitioners) who all work together to provide you with the care you need, when you need it.  We recommend signing up for the patient portal called "MyChart".  Sign up information is provided on this After Visit Summary.  MyChart is used to connect with patients for Virtual Visits (Telemedicine).  Patients are able to view lab/test results, encounter notes, upcoming appointments, etc.  Non-urgent messages can be sent to your provider as well.    To learn more about what you can do with MyChart, go to [email or web address].    Your next appointment:   12 month(s)  The format for your next appointment:   In Person  Provider:   You may see Lesleigh Noe, MD or one of the following Advanced Practice Providers on your designated Care Team:    Norma Fredrickson, NP  Nada Boozer, NP  Georgie Chard, NP    Other Instructions

## 2020-02-13 ENCOUNTER — Telehealth: Payer: Self-pay | Admitting: *Deleted

## 2020-02-13 DIAGNOSIS — G4733 Obstructive sleep apnea (adult) (pediatric): Secondary | ICD-10-CM

## 2020-02-13 NOTE — Telephone Encounter (Signed)
:   precert Lyn Records, MD  Reesa Chew, CMA  Okay with me.

## 2020-02-13 NOTE — Telephone Encounter (Signed)
Your physician recommends that you schedule a follow-up on your sleep study. split night sent to sleep pool.

## 2020-02-23 ENCOUNTER — Ambulatory Visit (HOSPITAL_COMMUNITY): Payer: Medicare PPO | Attending: Internal Medicine

## 2020-02-23 ENCOUNTER — Other Ambulatory Visit: Payer: Self-pay

## 2020-02-23 DIAGNOSIS — I480 Paroxysmal atrial fibrillation: Secondary | ICD-10-CM | POA: Diagnosis not present

## 2020-02-23 DIAGNOSIS — I10 Essential (primary) hypertension: Secondary | ICD-10-CM

## 2020-02-23 MED ORDER — PERFLUTREN LIPID MICROSPHERE
1.0000 mL | INTRAVENOUS | Status: AC | PRN
Start: 2020-02-23 — End: 2020-02-23
  Administered 2020-02-23: 2 mL via INTRAVENOUS

## 2020-04-11 ENCOUNTER — Telehealth: Payer: Self-pay | Admitting: *Deleted

## 2020-04-11 NOTE — Telephone Encounter (Signed)
Staff message sent to [facility] will not cover split night. Ok to order HST. No PA is required.

## 2020-04-13 NOTE — Telephone Encounter (Signed)
°  Alexandra Rogers, CMA  Reesa Chew, CMA Insurance will not cover split night. Ok to order HST patient already has OSA dx.

## 2020-04-16 NOTE — Addendum Note (Signed)
Addended by: Reesa Chew on: 04/16/2020 04:08 PM   Modules accepted: Orders

## 2020-04-17 DIAGNOSIS — M170 Bilateral primary osteoarthritis of knee: Secondary | ICD-10-CM | POA: Diagnosis not present

## 2020-04-17 DIAGNOSIS — F418 Other specified anxiety disorders: Secondary | ICD-10-CM | POA: Diagnosis not present

## 2020-04-17 DIAGNOSIS — R7309 Other abnormal glucose: Secondary | ICD-10-CM | POA: Diagnosis not present

## 2020-04-17 DIAGNOSIS — K219 Gastro-esophageal reflux disease without esophagitis: Secondary | ICD-10-CM | POA: Diagnosis not present

## 2020-04-17 DIAGNOSIS — I10 Essential (primary) hypertension: Secondary | ICD-10-CM | POA: Diagnosis not present

## 2020-04-17 NOTE — Telephone Encounter (Signed)
Patient is aware and agreeable to Home Sleep Study through [facility]. Patient is scheduled for 07/06/20 at 11:00 to pick up home sleep kit and meet with Respiratory therapist at [facility]. Patient is aware that if this appointment date and time does not work for them they should contact Artis Delay directly at [phone]. Patient is aware that a sleep packet will be sent from [facility] in week. Patient is agreeable to treatment and thankful for call.

## 2020-04-20 DIAGNOSIS — M170 Bilateral primary osteoarthritis of knee: Secondary | ICD-10-CM | POA: Diagnosis not present

## 2020-04-20 DIAGNOSIS — N959 Unspecified menopausal and perimenopausal disorder: Secondary | ICD-10-CM | POA: Diagnosis not present

## 2020-04-20 DIAGNOSIS — G4733 Obstructive sleep apnea (adult) (pediatric): Secondary | ICD-10-CM | POA: Diagnosis not present

## 2020-04-20 DIAGNOSIS — I10 Essential (primary) hypertension: Secondary | ICD-10-CM | POA: Diagnosis not present

## 2020-04-20 DIAGNOSIS — K219 Gastro-esophageal reflux disease without esophagitis: Secondary | ICD-10-CM | POA: Diagnosis not present

## 2020-04-20 DIAGNOSIS — F418 Other specified anxiety disorders: Secondary | ICD-10-CM | POA: Diagnosis not present

## 2020-04-20 DIAGNOSIS — I4892 Unspecified atrial flutter: Secondary | ICD-10-CM | POA: Diagnosis not present

## 2020-04-20 DIAGNOSIS — R7309 Other abnormal glucose: Secondary | ICD-10-CM | POA: Diagnosis not present

## 2020-07-06 ENCOUNTER — Encounter (HOSPITAL_BASED_OUTPATIENT_CLINIC_OR_DEPARTMENT_OTHER): Payer: Medicare PPO | Admitting: Cardiology

## 2020-09-11 DIAGNOSIS — M170 Bilateral primary osteoarthritis of knee: Secondary | ICD-10-CM | POA: Diagnosis not present

## 2020-10-18 ENCOUNTER — Other Ambulatory Visit: Payer: Self-pay | Admitting: Interventional Cardiology

## 2020-10-18 NOTE — Telephone Encounter (Signed)
Age 74, weight 129kg, SCr 0.83 on 12/05/19 at [facility] Last visit April 2021, afib indication

## 2020-11-08 ENCOUNTER — Other Ambulatory Visit: Payer: Self-pay | Admitting: Family Medicine

## 2020-11-08 DIAGNOSIS — E785 Hyperlipidemia, unspecified: Secondary | ICD-10-CM

## 2020-11-23 ENCOUNTER — Ambulatory Visit
Admission: RE | Admit: 2020-11-23 | Discharge: 2020-11-23 | Disposition: A | Discharge status: Home / Self Care | Payer: Self-pay | Source: Ambulatory Visit | Attending: Family Medicine | Admitting: Family Medicine

## 2020-11-23 DIAGNOSIS — E785 Hyperlipidemia, unspecified: Secondary | ICD-10-CM

## 2021-03-23 NOTE — Progress Notes (Signed)
Cardiology Office Note   Date:  03/26/2021   ID:  ANELIESE BEAUDRY, DOB [birth year], MRN [id number]  PCP:  Gweneth Dimitri, MD  Cardiologist: Dr. Verdis Prime, MD  Chief Complaint  Patient presents with  . Follow-up    History of Present Illness: Alexandra Rogers is a 75 y.o. female who presents for 1 year follow-up, seen for Dr. Katrinka Blazing.  Ms. Oswald has a history of syncope, LBBB, no CAD, PAF with prior DCCV due to persistent atrial flutter on chronic anticoagulation with Eliquis, and OSA not on CPAP.  She was last seen by Dr. Katrinka Blazing 02/07/2020 at which time she was noted to be gaining weight with her right greater than left lower extremity edema and varicose veins.  From a CV standpoint, she had no sensation of palpitations and was tolerating Eliquis with no bleeding.  Heart murmur noted on physical exam therefore echocardiogram was performed 02/23/2020 which showed an LVEF at 60 to 65% with no regional wall motion abnormalities, G1 DD, trivial MR and no other valvular disease.  There was aortic dilation noted measuring 39 mm.   Today she presents for follow up and reports she has been doing very well from a CV standpoint. She has been very busy this week cooking and doing projects for her church. She denies recurrent palpations. No chest pain, LE edema, PND, SOB or syncope. She had been tolerating her meds with no issue. She is to have full labs with PCP next week. Will have them fax to our team for review. No concerns today   Past Medical History:  Diagnosis Date  . Chronic anticoagulation 12/17/2016   CHADs VASc=3  . Dysrhythmia    LBBB, arrythmmia  . Hypertension   . LBBB (left bundle branch block)   . Normal coronary arteries 2004  . PAF (paroxysmal atrial fibrillation) (HCC) 12/17/2016  . Shortness of breath    on exertion due to weight    Past Surgical History:  Procedure Laterality Date  . APPENDECTOMY    . BREAST EXCISIONAL BIOPSY Right   . BREAST SURGERY     . CARDIOVERSION  12/17/2016   in ED for 2:1 A flutter with LBBB  . CHOLECYSTECTOMY    . HYSTEROSCOPY WITH D & C  09/22/2012   Procedure: DILATATION AND CURETTAGE /HYSTEROSCOPY;  Surgeon: Geryl Rankins, MD;  Location: [facility];  Service: Gynecology;  Laterality: N/A;  . LEFT HEART CATH  2004   Normal coronaries  . SHOULDER SURGERY    . VAGINAL DELIVERY  1978. 1983  . WRIST FRACTURE SURGERY       Current Outpatient Medications  Medication Sig Dispense Refill  . Acetaminophen (TYLENOL ARTHRITIS PAIN PO) Take 2 capsules by mouth daily.    . calcium-vitamin D (OSCAL WITH D) 500-200 MG-UNIT per tablet Take 1 tablet by mouth daily.    . Cholecalciferol (VITAMIN D) 2000 UNITS tablet Take 2,000 Units by mouth daily.    Marland Kitchen ELIQUIS 5 MG TABS tablet TAKE 1 TABLET BY MOUTH TWICE DAILY 180 tablet 1  . metoprolol succinate (TOPROL-XL) 100 MG 24 hr tablet Take 100 mg by mouth daily. Take with or immediately following a meal.    . Multiple Vitamin (MULTIVITAMIN WITH MINERALS) TABS Take 1 tablet by mouth daily.    . naftifine (NAFTIN) 1 % cream Apply 1 application topically daily as needed. (ring worm)  1  . progesterone (PROMETRIUM) 100 MG capsule Take 100 mg by mouth daily.    Marland Kitchen  valsartan-hydrochlorothiazide (DIOVAN-HCT) 320-12.5 MG tablet Take 1 tablet by mouth daily.     No current facility-administered medications for this visit.    Allergies:   Patient has no known allergies.    Social History:  The patient  reports that she has quit smoking. She has never used smokeless tobacco. She reports current alcohol use. She reports that she does not use drugs.   Family History:  The patient's family history includes Crohn's disease in her mother; Pancreatic cancer in her father.    ROS:  Please see the history of present illness.   Otherwise, review of systems are positive for none.   All other systems are reviewed and negative.    PHYSICAL EXAM: VS:  BP 138/82   Pulse 73   Ht 5\' 6"  (1.676 m)    Wt 284 lb 9.6 oz (129.1 kg)   SpO2 97%   BMI 45.94 kg/m  , BMI Body mass index is 45.94 kg/m.   General: Obese,  NAD Neck: Negative for carotid bruits. No JVD Lungs:Clear to ausculation bilaterally. Breathing is unlabored. Cardiovascular: RRR with S1 S2. No murmurs Extremities: No edema. Radial pulses 2+ bilaterally Neuro: Alert and oriented. No focal deficits. No facial asymmetry. MAE spontaneously. Psych: Responds to questions appropriately with normal affect.     EKG:  EKG is ordered today. The ekg ordered today demonstrates NSR with IVCD, HR 73bpm    Recent Labs: No results found for requested labs within last 8760 hours.    Lipid Panel No results found for: CHOL, TRIG, HDL, CHOLHDL, VLDL, LDLCALC, LDLDIRECT    Wt Readings from Last 3 Encounters:  03/26/21 284 lb 9.6 oz (129.1 kg)  02/07/20 284 lb 1.9 oz (128.9 kg)  09/20/18 264 lb 12.8 oz (120.1 kg)    Other studies Reviewed: Additional studies/ records that were reviewed today include:  Review of the above records demonstrates:   Echocardiogram 02/23/2020:  1. Left ventricular ejection fraction, by estimation, is 60 to 65%. The  left ventricle has normal function. The left ventricle has no regional  wall motion abnormalities. There is mild left ventricular hypertrophy.  Left ventricular diastolic parameters  are consistent with Grade I diastolic dysfunction (impaired relaxation).  2. Right ventricular systolic function is normal. The right ventricular  size is normal.  3. The mitral valve is grossly normal. Trivial mitral valve  regurgitation.  4. The aortic valve was not well visualized. Aortic valve regurgitation  is not visualized. Peak AV gradient is 10 mmHg.  5. Aortic dilatation noted. There is mild dilatation of the ascending  aorta measuring 39 mm.  6. The inferior vena cava is normal in size with greater than 50%  respiratory variability, suggesting right atrial pressure of 3 mmHg.    ASSESSMENT AND PLAN:  1.  Paroxysmal atrial fibrillation: -Reports no recurrence -Anticoagulated with Eliquis>>no s/s of bleeding in stool or urine -Continue metoprolol -Labs with PCP next week>>to be sent for our review  2.  Known LBBB: -EKG stable today   3. HTN: -Stable, 138/82 -Continue current regimen with no change   4.  OSA: -Referred to Dr. 02/25/2020 for CPAP titration>> follow   5.  Obesity: -BMI, 45 -Recommended for increased aerobic activity, heart healthy, low carb diet   6. Aortic dilation: -Noted on recent echo measuring 44mm -Continue with aggressive BP control, serial study update    Current medicines are reviewed at length with the patient today.  The patient does not have concerns regarding medicines.  The  following changes have been made:  no change  Labs/ tests ordered today include: None  Orders Placed This Encounter  Procedures  . EKG 12-Lead     Disposition:   FU with Dr. Katrinka Blazing in 1 year  Signed, Georgie Chard, NP  03/26/2021 4:22 PM    [facility] [address] [place], [place], [place]  [address] Phone: [phone]; Fax: [phone]

## 2021-03-26 ENCOUNTER — Ambulatory Visit (INDEPENDENT_AMBULATORY_CARE_PROVIDER_SITE_OTHER): Payer: Medicare PPO | Admitting: Cardiology

## 2021-03-26 ENCOUNTER — Encounter: Payer: Self-pay | Admitting: Cardiology

## 2021-03-26 ENCOUNTER — Other Ambulatory Visit: Payer: Self-pay

## 2021-03-26 VITALS — BP 138/82 | HR 73 | Ht 66.0 in | Wt 284.6 lb

## 2021-03-26 DIAGNOSIS — G4733 Obstructive sleep apnea (adult) (pediatric): Secondary | ICD-10-CM | POA: Diagnosis not present

## 2021-03-26 DIAGNOSIS — Z7901 Long term (current) use of anticoagulants: Secondary | ICD-10-CM | POA: Diagnosis not present

## 2021-03-26 DIAGNOSIS — I480 Paroxysmal atrial fibrillation: Secondary | ICD-10-CM

## 2021-03-26 DIAGNOSIS — I10 Essential (primary) hypertension: Secondary | ICD-10-CM | POA: Diagnosis not present

## 2021-03-26 DIAGNOSIS — I447 Left bundle-branch block, unspecified: Secondary | ICD-10-CM

## 2021-03-26 NOTE — Patient Instructions (Signed)
Medication Instructions:  °Your physician recommends that you continue on your current medications as directed. Please refer to the Current Medication list given to you today. ° °*If you need a refill on your cardiac medications before your next appointment, please call your pharmacy* ° ° °Lab Work: °NONE °If you have labs (blood work) drawn today and your tests are completely normal, you will receive your results only by: °MyChart Message (if you have MyChart) OR °A paper copy in the mail °If you have any lab test that is abnormal or we need to change your treatment, we will call you to review the results. ° ° °Testing/Procedures: °NONE ° ° °Follow-Up: °At [facility], you and your health needs are our priority.  As part of our continuing mission to provide you with exceptional heart care, we have created designated Provider Care Teams.  These Care Teams include your primary Cardiologist (physician) and Advanced Practice Providers (APPs -  Physician Assistants and Nurse Practitioners) who all work together to provide you with the care you need, when you need it. ° °We recommend signing up for the patient portal called "MyChart".  Sign up information is provided on this After Visit Summary.  MyChart is used to connect with patients for Virtual Visits (Telemedicine).  Patients are able to view lab/test results, encounter notes, upcoming appointments, etc.  Non-urgent messages can be sent to your provider as well.   °To learn more about what you can do with MyChart, go to [email or web address].   ° °Your next appointment:   °1 year(s) ° °The format for your next appointment:   °In Person ° °Provider:   °Henry W Smith III, MD   ° °

## 2021-04-21 ENCOUNTER — Other Ambulatory Visit: Payer: Self-pay | Admitting: Interventional Cardiology

## 2021-04-21 DIAGNOSIS — I480 Paroxysmal atrial fibrillation: Secondary | ICD-10-CM

## 2021-04-22 NOTE — Telephone Encounter (Signed)
Eliquis 5mg  refill request received. Patient is 75 years old, weight-129.1kg, Crea-1.03 on 11/06/20 via [facility] at [place], [place], and last seen by Orvil Feil, NP on 03/26/2021. Dose is appropriate based on dosing criteria. Will send in refill to requested pharmacy.

## 2021-05-02 DIAGNOSIS — U071 COVID-19: Secondary | ICD-10-CM | POA: Diagnosis not present

## 2021-05-31 DIAGNOSIS — E785 Hyperlipidemia, unspecified: Secondary | ICD-10-CM | POA: Diagnosis not present

## 2021-05-31 DIAGNOSIS — I480 Paroxysmal atrial fibrillation: Secondary | ICD-10-CM | POA: Diagnosis not present

## 2021-05-31 DIAGNOSIS — I700 Atherosclerosis of aorta: Secondary | ICD-10-CM | POA: Diagnosis not present

## 2021-05-31 DIAGNOSIS — D6869 Other thrombophilia: Secondary | ICD-10-CM | POA: Diagnosis not present

## 2021-05-31 DIAGNOSIS — G4733 Obstructive sleep apnea (adult) (pediatric): Secondary | ICD-10-CM | POA: Diagnosis not present

## 2021-05-31 DIAGNOSIS — M170 Bilateral primary osteoarthritis of knee: Secondary | ICD-10-CM | POA: Diagnosis not present

## 2021-05-31 DIAGNOSIS — I10 Essential (primary) hypertension: Secondary | ICD-10-CM | POA: Diagnosis not present

## 2021-05-31 DIAGNOSIS — R7309 Other abnormal glucose: Secondary | ICD-10-CM | POA: Diagnosis not present

## 2021-06-03 ENCOUNTER — Other Ambulatory Visit: Payer: Self-pay | Admitting: Family Medicine

## 2021-06-03 DIAGNOSIS — R7303 Prediabetes: Secondary | ICD-10-CM | POA: Diagnosis not present

## 2021-06-03 DIAGNOSIS — I480 Paroxysmal atrial fibrillation: Secondary | ICD-10-CM | POA: Diagnosis not present

## 2021-06-03 DIAGNOSIS — G4733 Obstructive sleep apnea (adult) (pediatric): Secondary | ICD-10-CM | POA: Diagnosis not present

## 2021-06-03 DIAGNOSIS — Z79899 Other long term (current) drug therapy: Secondary | ICD-10-CM | POA: Diagnosis not present

## 2021-06-03 DIAGNOSIS — N959 Unspecified menopausal and perimenopausal disorder: Secondary | ICD-10-CM | POA: Diagnosis not present

## 2021-06-03 DIAGNOSIS — E785 Hyperlipidemia, unspecified: Secondary | ICD-10-CM | POA: Diagnosis not present

## 2021-06-03 DIAGNOSIS — M8580 Other specified disorders of bone density and structure, unspecified site: Secondary | ICD-10-CM | POA: Diagnosis not present

## 2021-06-03 DIAGNOSIS — I10 Essential (primary) hypertension: Secondary | ICD-10-CM | POA: Diagnosis not present

## 2021-06-03 DIAGNOSIS — M170 Bilateral primary osteoarthritis of knee: Secondary | ICD-10-CM | POA: Diagnosis not present

## 2021-06-03 DIAGNOSIS — Z1231 Encounter for screening mammogram for malignant neoplasm of breast: Secondary | ICD-10-CM

## 2021-06-23 IMAGING — CT CT CARDIAC CORONARY ARTERY CALCIUM SCORE
3 series · 14 of 20 positions shown, 16 images · non-contrast
Comparison: None.

CLINICAL DATA: Borderline hyperlipidemia

EXAM:
CT CARDIAC CORONARY ARTERY CALCIUM SCORE
TECHNIQUE: Non-contrast imaging through the heart was performed using
prospective ECG gating. Image post processing was performed on an
independent workstation, allowing for quantitative analysis of the
heart and coronary arteries. Note that this exam targets the heart
and the chest was not imaged in its entirety.

[Series 2: calcium scoring 2.00 qr36 bestdiast 70% hrt calciu · axial · 0.46mm/px · z∈[+1653,+1737]mm · 4 of 70 slices shown]
[im 14/70  vessel]
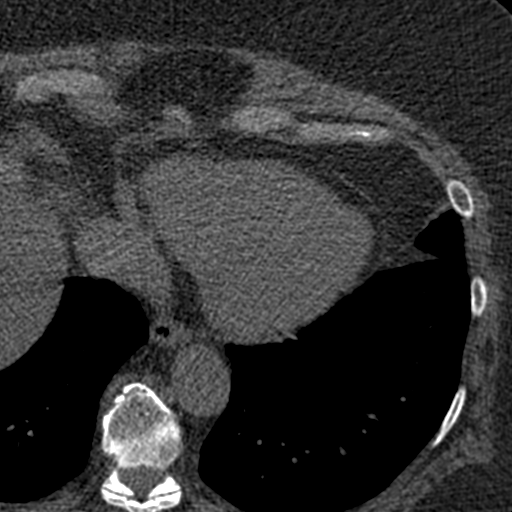
[im 28/70  vessel]
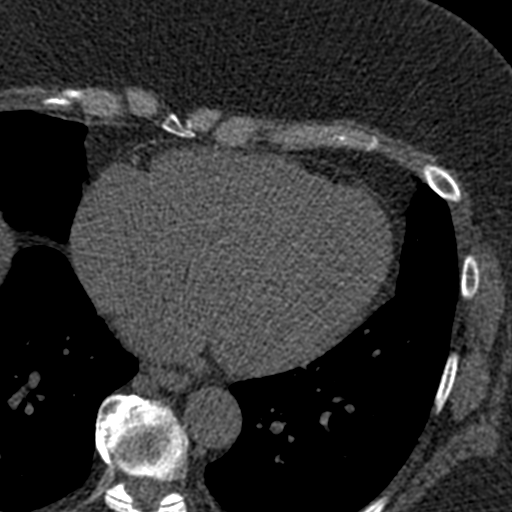
[im 42/70  vessel]
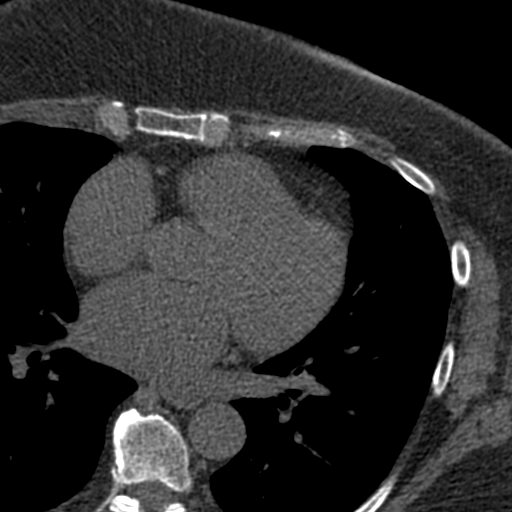
[im 56/70  vessel]
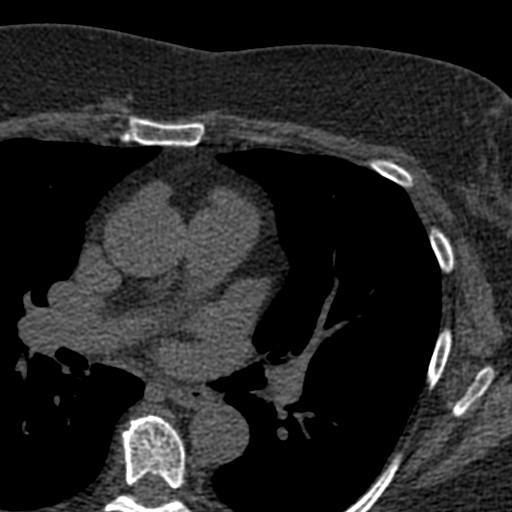

[Series 3: calcium scoring 2.00 br40 bestdiast 70% axial · axial · 0.67mm/px · z∈[+1649,+1741]mm · 5 of 70 slices shown, 7 images]
[im 12/70  vessel]
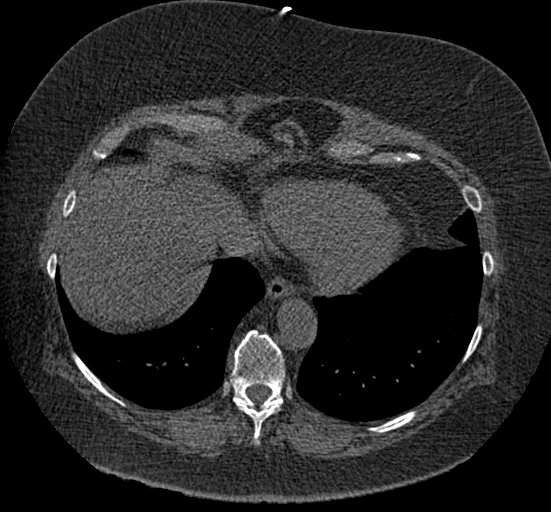
[im 12/70  lung]
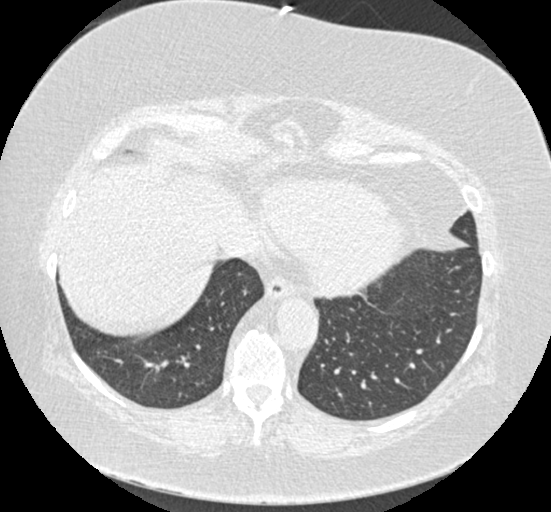
[im 24/70  vessel]
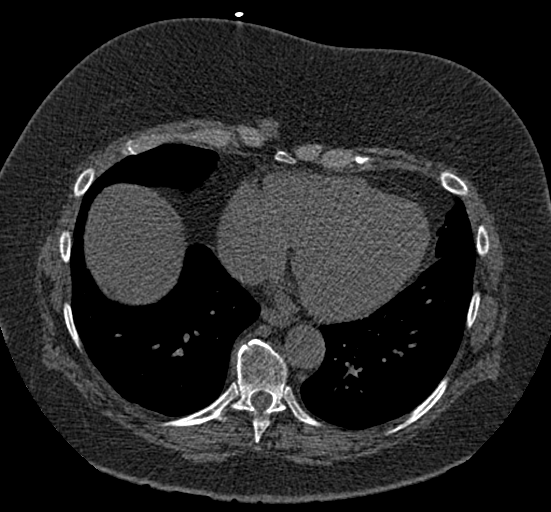
[im 35/70  vessel]
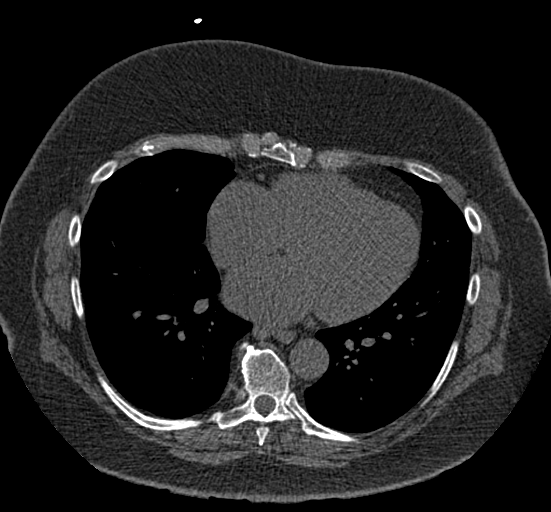
[im 47/70  vessel]
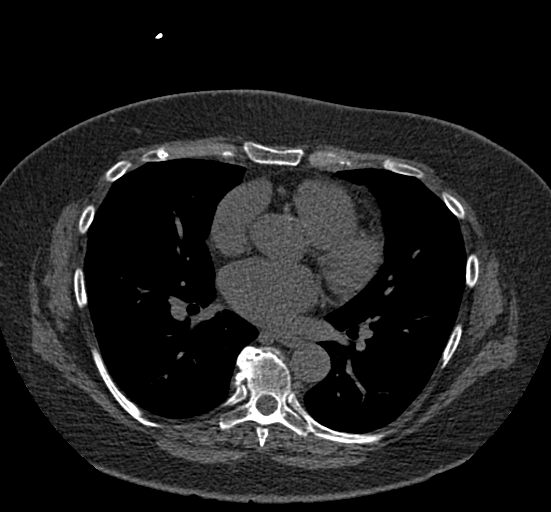
[im 58/70  vessel]
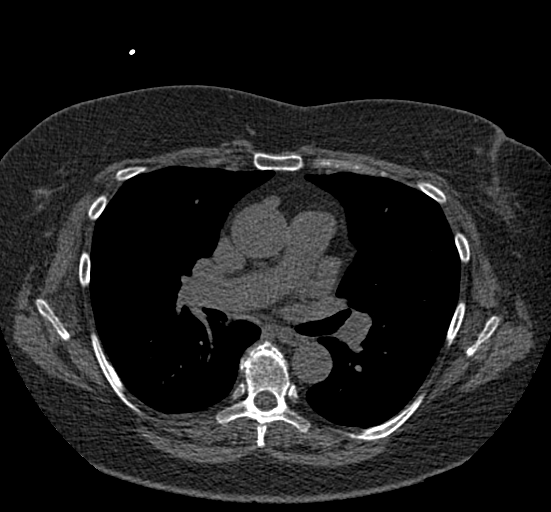
[im 58/70  lung]
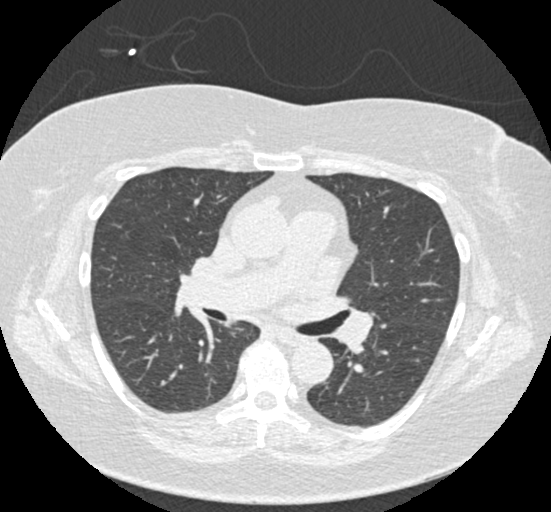

[Series 9: calcium scoring 2.00 br60 bestdiast 70% lungs · axial · 0.69mm/px · z∈[+1649,+1741]mm · 5 of 70 slices shown]
[im 12/70  vessel]
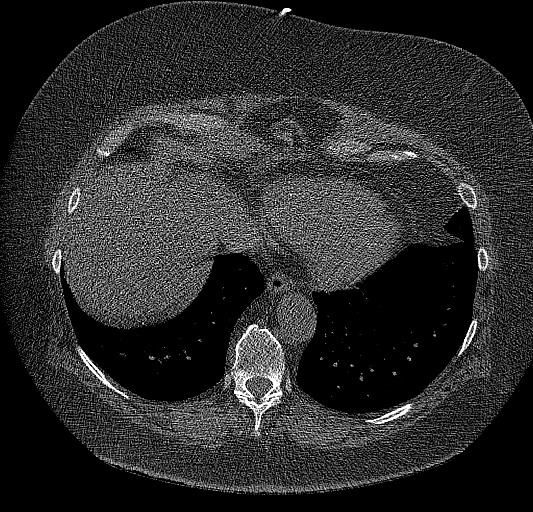
[im 24/70  vessel]
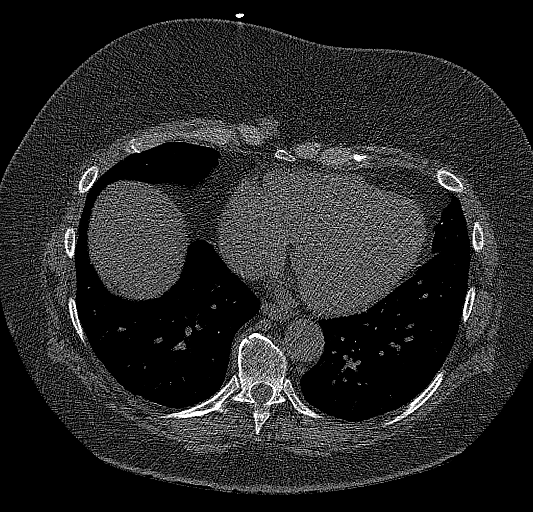
[im 35/70  vessel]
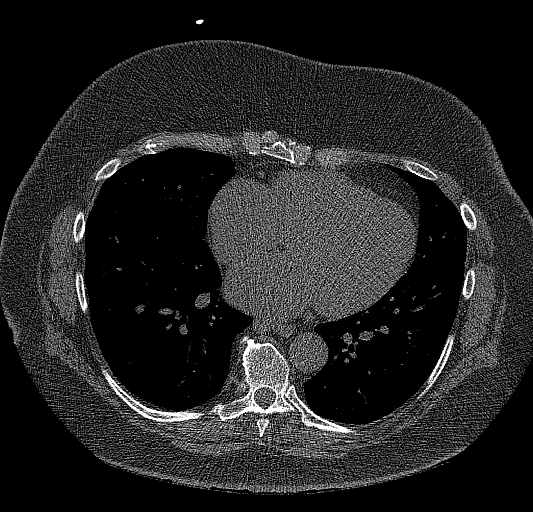
[im 47/70  vessel]
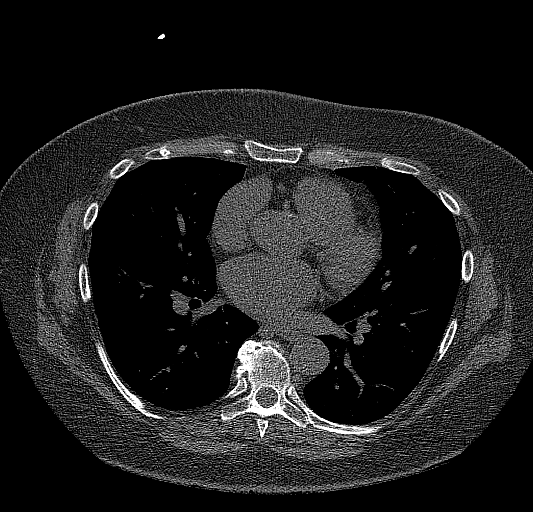
[im 58/70  vessel]
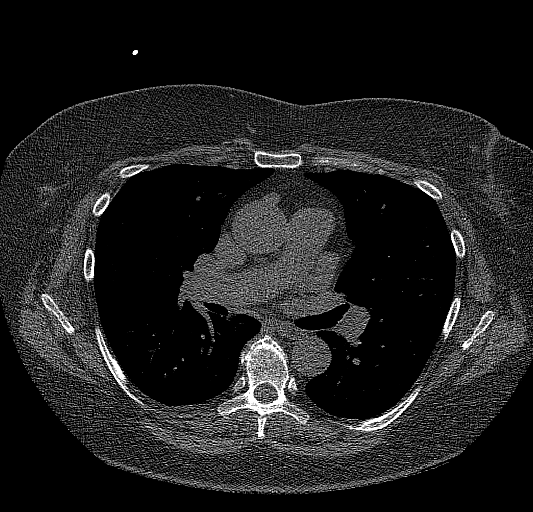

[14 of 20 positions shown; findings below may reference images not displayed]

FINDINGS: CORONARY CALCIUM SCORES:

Left Main: 0

LAD: 0

LCx: 0

RCA: 0

Total Agatston Score: 0

[HOSPITAL] percentile: 0

AORTA MEASUREMENTS:

Ascending Aorta: 38 mm

Descending Aorta: 29 mm

OTHER FINDINGS:

Heart is normal size. Scattered calcifications in the proximal
ascending thoracic aorta/aortic root and descending thoracic aorta.
No aneurysm. No adenopathy. No confluent opacities or effusions.
Imaging into the upper abdomen demonstrates no acute findings. Chest
wall soft tissues are unremarkable. No acute bony abnormality.
IMPRESSION: No visible coronary artery calcifications. Total coronary calcium
score of 0.

Scattered aortic calcifications.

## 2021-07-17 ENCOUNTER — Telehealth: Payer: Self-pay | Admitting: *Deleted

## 2021-07-17 NOTE — Telephone Encounter (Signed)
   [facility] Pre-operative Risk Assessment    Patient Name: Alexandra Rogers  DOB: [birth year] MRN: [id number]  HEARTCARE STAFF:  - IMPORTANT!!!!!! Under Visit Info/Reason for Call, type in Other and utilize the format Clearance MM/DD/YY or Clearance TBD. Do not use dashes or single digits. - Please review there is not already an duplicate clearance open for this procedure. - If request is for dental extraction, please clarify the # of teeth to be extracted. - If the patient is currently at the dentist's office, call Pre-Op Callback Staff (MA/nurse) to input urgent request.  - If the patient is not currently in the dentist office, please route to the Pre-Op pool.  Request for surgical clearance:  What type of surgery is being performed?  COLONOSCOPY  When is this surgery scheduled?  11/05/21  What type of clearance is required (medical clearance vs. Pharmacy clearance to hold med vs. Both)?  PHARMACY  Are there any medications that need to be held prior to surgery and how long?  [place] name and name of physician performing surgery?  [facility] / DR. Michail Sermon  What is the office phone number?  [phone]   7.   What is the office fax number?  [phone]  8.   Anesthesia type (None, local, MAC, general) ?  PROPOFOL    [name] 07/17/2021, 6:57 AM  _________________________________________________________________   (provider comments below)

## 2021-07-17 NOTE — Telephone Encounter (Signed)
Patient with diagnosis of afib on Eliquis for anticoagulation.    Procedure: colonoscopy Date of procedure: 11/05/21  CHA2DS2-VASc Score = 3  This indicates a 3.2% annual risk of stroke. The patient's score is based upon: CHF History: 0 HTN History: 1 Diabetes History: 0 Stroke History: 0 Vascular Disease History: 0 Age Score: 1 Gender Score: 1   CrCl 51mL/min using adjusted body weight due to obesity  Per office protocol, patient can hold Eliquis for 2 days prior to procedure assuming no major clinical changes (ie stroke, scheduled cardioversion or ablation, etc) before procedure (not scheduled for ~4 months).

## 2021-07-17 NOTE — Telephone Encounter (Signed)
Covering preop today. Will route to pharm then patient will need call. Last OV 02/2021, was doing well with plan for 1 year f/u.

## 2021-10-03 ENCOUNTER — Telehealth: Payer: Self-pay | Admitting: Interventional Cardiology

## 2021-10-03 ENCOUNTER — Encounter: Payer: Self-pay | Admitting: Interventional Cardiology

## 2021-10-03 DIAGNOSIS — R001 Bradycardia, unspecified: Secondary | ICD-10-CM

## 2021-10-03 DIAGNOSIS — I480 Paroxysmal atrial fibrillation: Secondary | ICD-10-CM

## 2021-10-03 NOTE — Telephone Encounter (Signed)
STAT if HR is under 50 or over 120 (normal HR is 60-100 beats per minute)  What is your heart rate?  39  Do you have a log of your heart rate readings (document readings)? Pt does not take her bp.  Do you have any other symptoms? TIRED  PT'S CURRENT BP 155/57 PT COOKED DINNER FOR 100 PPL AT [facility] AND SHE HAS BEEN UNDER A TREMENDOUS AMOUNT OF STRESS LATELY

## 2021-10-03 NOTE — Telephone Encounter (Signed)
Discussed pt concern. She reports HR reading 39 on BP cuff. 155/57 She reports she is SOB just walking up her stairs (about 15 stairs), and she doesn't walk fast up them to begin with as she has bad knee. She does not have issues on one level, but hills/stairs causes SOB. She denies CP. She did have some chest tightness with episode, that last about 20 min,  but she also reports she has a bad cough and phlegm and feels this could be related to that as well. She does feel palpitations, but they are short lived. Hx PAF She did miss her medications last Friday morning. Last episode was 11/1 at her sisters, lasted about 20 min.  She was dizzy during this event and had bad diarrhea that morning.   Pt will continue monitoring for now. Advised to call office if episodes start to occur more frequently, she is out of rhythm for more than 24-48 hours, and/or symptoms worsen/begin. Patient verbalized understanding and agreeable to plan.

## 2021-10-11 ENCOUNTER — Ambulatory Visit (INDEPENDENT_AMBULATORY_CARE_PROVIDER_SITE_OTHER): Payer: Medicare PPO

## 2021-10-11 DIAGNOSIS — I480 Paroxysmal atrial fibrillation: Secondary | ICD-10-CM

## 2021-10-11 DIAGNOSIS — R001 Bradycardia, unspecified: Secondary | ICD-10-CM

## 2021-10-11 NOTE — Progress Notes (Unsigned)
Enrolled patient for a 14 day Zio XT  monitor to be mailed to patients home  °

## 2021-10-11 NOTE — Addendum Note (Signed)
Addended by: Alois Cliche on: 10/11/2021 03:19 PM   Modules accepted: Orders

## 2021-10-11 NOTE — Telephone Encounter (Signed)
Attempted phone call to pt to advise of Dr Michaelle Copas recommendation to wear Zio monitor for 2 weeks.  Per Epic ok to leave voicemail.  Left voicemail message regarding Dr Michaelle Copas recommendation and canceled appointment for 10/14/2021 with Dr Katrinka Blazing.  Order placed for 2 week Zio.  Pt advised to contact [phone] for any questions or concerns.

## 2021-10-11 NOTE — Telephone Encounter (Signed)
Spoke with pt who reports for the past week she has had episodes of low HR into the 40's.  Today at 945 her HR was 40 and per [facility] she is not in Afib or flutter.  She took Metoprolol Succinate 100mg  and Diovan at 8am this morning.  Pt reports she is mildly lightheaded but denies current CP or SOB at this time.  She does note that she has increasing SOB on exertion over the past week as well.  Pt denies edema.  She has not taken her BP today and is unable to transmit a current HR from device due to being on the phone.     Pt advised will forward information to Dr for review and recommendation.  Reviewed ED precautions with pt.  Encouraged pt to continue monitoring HR and check BP.  Pt verbalizes understanding and agrees with current plan.

## 2021-10-11 NOTE — Telephone Encounter (Signed)
STAT if HR is under 50 or over 120 (normal HR is 60-100 beats per minute)  What is your heart rate? 40  Do you have a log of your heart rate readings (document readings)? Ranging in the 40's for the past week   Do you have any other symptoms? SOB and Dizziness with occasional Afib

## 2021-10-14 ENCOUNTER — Ambulatory Visit: Payer: Medicare PPO | Admitting: Interventional Cardiology

## 2021-10-14 DIAGNOSIS — R001 Bradycardia, unspecified: Secondary | ICD-10-CM | POA: Diagnosis not present

## 2021-10-14 DIAGNOSIS — I480 Paroxysmal atrial fibrillation: Secondary | ICD-10-CM

## 2021-10-29 ENCOUNTER — Telehealth: Payer: Self-pay | Admitting: Interventional Cardiology

## 2021-10-29 NOTE — Telephone Encounter (Signed)
Spoke with pt who states that on this morning her HR was 38. She is unable to walk up the steps with out being SOB and tired. She does reports that she does have a cough that started 5-6 wks ago. She reports that she did use her Kardia monitor witch showed Hr of 38. Pt voices that she would like to be seen in office regarding this. She ask if it is ok to come off her Eliquis on Sunday for her procedure. Pt did wear Zio patch for 14 days and returned it on Monday morning. Instructed pt to continue to monitor at this time. Please advise.

## 2021-10-29 NOTE — Telephone Encounter (Signed)
°  Per MyChart scheduling message:   My heart rate this morning was 38. With a heart rate consistently around 40, I want someone to listen to my heart.   I do have a question. Im scheduled to have a colonoscopy next Tuesday with Dr Bosie Clos. They want me to go off the Eliquis on Sunday. Is it ok to have this procedure at this time?

## 2021-10-30 ENCOUNTER — Encounter: Payer: Self-pay | Admitting: Interventional Cardiology

## 2021-10-31 ENCOUNTER — Telehealth: Payer: Self-pay | Admitting: Physician Assistant

## 2021-10-31 ENCOUNTER — Other Ambulatory Visit: Payer: Self-pay

## 2021-10-31 ENCOUNTER — Ambulatory Visit: Payer: Medicare PPO | Admitting: Interventional Cardiology

## 2021-10-31 ENCOUNTER — Other Ambulatory Visit: Payer: Medicare PPO | Admitting: *Deleted

## 2021-10-31 ENCOUNTER — Encounter: Payer: Self-pay | Admitting: Interventional Cardiology

## 2021-10-31 VITALS — BP 132/60 | HR 40 | Ht 65.5 in | Wt 279.0 lb

## 2021-10-31 DIAGNOSIS — I480 Paroxysmal atrial fibrillation: Secondary | ICD-10-CM

## 2021-10-31 DIAGNOSIS — R001 Bradycardia, unspecified: Secondary | ICD-10-CM | POA: Diagnosis not present

## 2021-10-31 DIAGNOSIS — I442 Atrioventricular block, complete: Secondary | ICD-10-CM | POA: Diagnosis not present

## 2021-10-31 DIAGNOSIS — R5383 Other fatigue: Secondary | ICD-10-CM

## 2021-10-31 DIAGNOSIS — I10 Essential (primary) hypertension: Secondary | ICD-10-CM

## 2021-10-31 MED ORDER — FUROSEMIDE 40 MG PO TABS: ORAL_TABLET | ORAL | 0 refills | Status: DC

## 2021-10-31 MED ORDER — APIXABAN 5 MG PO TABS: 5.0000 mg | ORAL_TABLET | Freq: Two times a day (BID) | ORAL | 3 refills | Status: DC

## 2021-10-31 NOTE — Telephone Encounter (Addendum)
° ° °  Patient Name: Alexandra Rogers  DOB: September 09, 1946 MRN: [id number]  Primary Cardiologist: Sinclair Grooms, MD  I am covering preop team today. Our team received a MyChart msg from patient as below:    ----- Message -----      From:Kristain L Cotterill      Sent:10/30/2021  5:21 PM EST        SV:[id number] Nicholes Stairs III, MD   Subject:Scheduled Colonoscopy - yes or no  I have not yet heard back if I should go ahead with the scheduled colonoscopy next Tuesday, Jan 10. Dr Michail Sermon wants me to stop taking the Eliquis on Sunday.  Is this procedure appropriate at this time with my low heart rate?   I replied to msg in [place] letting her know we would review with Dr. Tamala Julian- her original clearance was faxed in on 06/2021 by [facility] PA-C but patient had since called the office with slow heart rate with monitor in progress. I do see that she had an additional note sent in on 10/29/20 with another low HR 38 with complaints of feeling short of breath and tired. Spoke with patient who states this has been intermittently happening since 09/26/2021. She has not had any syncope or chest pain. Also seems to have DOE sometimes when HR is normal. When HR is low it feels like it's pounding harder. Today HR is in the 60s and feels OK. Per d/w Dr. Tamala Julian, needs to come in for EKG. Have arranged for her to see Dr. Elisabeth Cara in DOD slot at 4:30pm (discussed with Drue Novel). Msg sent to Dr. Criss Alvine RN as Juluis Rainier. Dr. Irish Lack recommends to obtain CMET, CBC, TSH before visit. Patient aware to arrive at 4:05pm to give time for blood draw. Monitor team also tagged on secure chat to see if they can obtain readings but states monitor is still in transient to [employer]. Patient will also bring her Kardia tracings with her. She already took metoprolol today but told her to hold off further dosing until she discusses with provider.  Preop team will follow peripherally for recs regarding proceeding with colonoscopy which is  elective per patient. Will route this update to requiring GI team (Dr. Michail Sermon) so they are aware. This is the original clearance request below:  Request for surgical clearance:   What type of surgery is being performed?  COLONOSCOPY   When is this surgery scheduled?  11/05/21   What type of clearance is required (medical clearance vs. Pharmacy clearance to hold med vs. Both)?  PHARMACY   Are there any medications that need to be held prior to surgery and how long?  [place] name and name of physician performing surgery?  [facility] / DR. Michail Sermon   What is the office phone number?  [phone]   7.   What is the office fax number?  [phone]   8.   Anesthesia type (None, local, MAC, general) ?  PROPOFOL      [name] 07/17/2021, 6:57 AM  _________________________________________________________________   (provider comments below)    Charlie Pitter, PA-C  10/31/2021, 7:58 AM

## 2021-10-31 NOTE — Telephone Encounter (Addendum)
I am covering preop today. To ensure the stream of events is easier to follow, will document updated recommendations in a phone note from today as there have been multiple interim messages since this original phone note was created. See 10/31/21 phone note for updated recs.

## 2021-10-31 NOTE — Patient Instructions (Signed)
Medication Instructions:  Your physician has recommended you make the following change in your medication: Stop metoprolol. Take furosemide 40 mg by mouth daily for 3 days  *If you need a refill on your cardiac medications before your next appointment, please call your pharmacy*   Lab Work: none If you have labs (blood work) drawn today and your tests are completely normal, you will receive your results only by: [place] (if you have MyChart) OR A paper copy in the mail If you have any lab test that is abnormal or we need to change your treatment, we will call you to review the results.   Testing/Procedures: none   Follow-Up: At [facility], you and your health needs are our priority.  As part of our continuing mission to provide you with exceptional heart care, we have created designated Provider Care Teams.  These Care Teams include your primary Cardiologist (physician) and Advanced Practice Providers (APPs -  Physician Assistants and Nurse Practitioners) who all work together to provide you with the care you need, when you need it.  We recommend signing up for the patient portal called "MyChart".  Sign up information is provided on this After Visit Summary.  MyChart is used to connect with patients for Virtual Visits (Telemedicine).  Patients are able to view lab/test results, encounter notes, upcoming appointments, etc.  Non-urgent messages can be sent to your provider as well.   To learn more about what you can do with MyChart, go to [email or web address].    Other Instructions Check heart rate tomorrow and call office with an update.  You have been referred to Electrophysiology. Please schedule new patient appointment

## 2021-10-31 NOTE — Progress Notes (Signed)
Cardiology Office Note   Date:  10/31/2021   ID:  HISAE COUGILL, DOB [birth year], MRN [id number]  PCP:  Cari Caraway, MD    Chief Complaint  Patient presents with   Follow-up   bradycardia  Wt Readings from Last 3 Encounters:  10/31/21 279 lb (126.6 kg)  03/26/21 284 lb 9.6 oz (129.1 kg)  02/07/20 284 lb 1.9 oz (128.9 kg)       History of Present Illness: Alexandra Rogers is a 76 y.o. female  with AFib, PVCs.  She has been on metoprolol XL 100 mg, last dose this AM.    She noticed her heart rate was 38-40 at times.  At times it was 60.  She checked on her cardia mobile and her heart rate was registering as normal sinus rhythm when it was in the 60s.  Denies : Chest pain. Dizziness. Leg edema. Nitroglycerin use. Orthopnea. Palpitations. Paroxysmal nocturnal dyspnea. Syncope.   She has had increased shortness of breath in the last few days as well.  Her weight has been stable.    Past Medical History:  Diagnosis Date   Chronic anticoagulation 12/17/2016   CHADs VASc=3   Dysrhythmia    LBBB, arrythmmia   Hypertension    LBBB (left bundle branch block)    Normal coronary arteries 2004   PAF (paroxysmal atrial fibrillation) ([place]) 12/17/2016   Shortness of breath    on exertion due to weight    Past Surgical History:  Procedure Laterality Date   APPENDECTOMY     BREAST EXCISIONAL BIOPSY Right    BREAST SURGERY     CARDIOVERSION  12/17/2016   in ED for 2:1 A flutter with LBBB   CHOLECYSTECTOMY     HYSTEROSCOPY WITH D & C  09/22/2012   Procedure: DILATATION AND CURETTAGE /HYSTEROSCOPY;  Surgeon: Thurnell Lose, MD;  Location: [facility];  Service: Gynecology;  Laterality: N/A;   LEFT HEART CATH  2004   Normal coronaries   SHOULDER SURGERY     VAGINAL DELIVERY  1978. 1983   WRIST FRACTURE SURGERY       Current Outpatient Medications  Medication Sig Dispense Refill   Acetaminophen (TYLENOL ARTHRITIS PAIN PO) Take 2 capsules by mouth daily.      calcium-vitamin D (OSCAL WITH D) 500-200 MG-UNIT per tablet Take 1 tablet by mouth daily.     Cholecalciferol (VITAMIN D) 2000 UNITS tablet Take 2,000 Units by mouth daily.     ELIQUIS 5 MG TABS tablet TAKE 1 TABLET BY MOUTH TWICE DAILY 180 tablet 1   metoprolol succinate (TOPROL-XL) 100 MG 24 hr tablet Take 100 mg by mouth daily. Take with or immediately following a meal.     Multiple Vitamin (MULTIVITAMIN WITH MINERALS) TABS Take 1 tablet by mouth daily.     naftifine (NAFTIN) 1 % cream Apply 1 application topically daily as needed. (ring worm)  1   progesterone (PROMETRIUM) 100 MG capsule Take 100 mg by mouth daily.     rosuvastatin (CRESTOR) 5 MG tablet Take 5 mg by mouth daily.     valsartan-hydrochlorothiazide (DIOVAN-HCT) 320-12.5 MG tablet Take 1 tablet by mouth daily.     No current facility-administered medications for this visit.    Allergies:   Patient has no known allergies.    Social History:  The patient  reports that she has quit smoking. She has never used smokeless tobacco. She reports current alcohol use. She reports that she does not use drugs.  Family History:  The patient's family history includes Crohn's disease in her mother; Pancreatic cancer in her father.    ROS:  Please see the history of present illness.   Otherwise, review of systems are positive for fatigue.   All other systems are reviewed and negative.    PHYSICAL EXAM: VS:  BP 132/60    Pulse (!) 40    Ht 5' 5.5" (1.664 m)    Wt 279 lb (126.6 kg)    SpO2 98%    BMI 45.72 kg/m  , BMI Body mass index is 45.72 kg/m. GEN: Well nourished, well developed, in no acute distress HEENT: normal Neck: no JVD, carotid bruits, or masses Cardiac: Bradycardic; no murmurs, rubs, or gallops,no edema  Respiratory:  clear to auscultation bilaterally, normal work of breathing GI: soft, nontender, nondistended, + BS MS: no deformity or atrophy Skin: warm and dry, no rash Neuro:  Strength and sensation are  intact Psych: euthymic mood, full affect   EKG:   The ekg ordered today demonstrates normal sinus rhythm with third-degree AV block, wide QRS escape rhythm   Recent Labs: No results found for requested labs within last 8760 hours.   Lipid Panel No results found for: CHOL, TRIG, HDL, CHOLHDL, VLDL, LDLCALC, LDLDIRECT   Other studies Reviewed: Additional studies/ records that were reviewed today with results demonstrating: Labs pending, cardio mobile reviewed.   ASSESSMENT AND PLAN:  Intermittent third-degree AV block.  No syncope.  We will stop metoprolol.  She will check her heart rate with a cardia mobile.  If it is in the 40s, I suspect she is in complete heart block.  I counseled her not to drive.  She will have someone pick her up from the office today.  Hopefully, within 48 hours, her heart rate will be back to the normal rhythm. Anticoagulation: History of A. fib. PVCs: She is worried about arrhythmias off of the metoprolol.  Will have her see electrophysiology.  Perhaps she would tolerate a low-dose of metoprolol. HTN: The current medical regimen is effective;  continue present plan and medications.    Current medicines are reviewed at length with the patient today.  The patient concerns regarding her medicines were addressed.  The following changes have been made:  No change  Labs/ tests ordered today include:  No orders of the defined types were placed in this encounter.   Recommend 150 minutes/week of aerobic exercise Low fat, low carb, high fiber diet recommended  Disposition:   FU in a few days for f/u ECG   Signed, Larae Grooms, MD  10/31/2021 4:50 PM    [facility] [address], [place], [place]  [address] Phone: [phone]; Fax: [phone]

## 2021-11-01 ENCOUNTER — Telehealth: Payer: Self-pay | Admitting: Interventional Cardiology

## 2021-11-01 LAB — CBC
Hematocrit: 42.4 % (ref 34.0–46.6)
Hemoglobin: 14.3 g/dL (ref 11.1–15.9)
MCH: 32.1 pg (ref 26.6–33.0)
MCHC: 33.7 g/dL (ref 31.5–35.7)
MCV: 95 fL (ref 79–97)
Platelets: 315 10*3/uL (ref 150–450)
RBC: 4.45 x10E6/uL (ref 3.77–5.28)
RDW: 11.9 % (ref 11.7–15.4)
WBC: 9.1 10*3/uL (ref 3.4–10.8)

## 2021-11-01 LAB — COMPREHENSIVE METABOLIC PANEL
ALT: 15 IU/L (ref 0–32)
AST: 18 IU/L (ref 0–40)
Albumin/Globulin Ratio: 2 (ref 1.2–2.2)
Albumin: 4.5 g/dL (ref 3.7–4.7)
Alkaline Phosphatase: 68 IU/L (ref 44–121)
BUN/Creatinine Ratio: 19 (ref 12–28)
BUN: 20 mg/dL (ref 8–27)
Bilirubin Total: 0.4 mg/dL (ref 0.0–1.2)
CO2: 26 mmol/L (ref 20–29)
Calcium: 9.6 mg/dL (ref 8.7–10.3)
Chloride: 101 mmol/L (ref 96–106)
Creatinine, Ser: 1.04 mg/dL — ABNORMAL HIGH (ref 0.57–1.00)
Globulin, Total: 2.2 g/dL (ref 1.5–4.5)
Glucose: 108 mg/dL — ABNORMAL HIGH (ref 70–99)
Potassium: 4.3 mmol/L (ref 3.5–5.2)
Sodium: 141 mmol/L (ref 134–144)
Total Protein: 6.7 g/dL (ref 6.0–8.5)
eGFR: 57 mL/min/{1.73_m2} — ABNORMAL LOW (ref >59)

## 2021-11-01 LAB — TSH: TSH: 2.94 u[IU]/mL (ref 0.450–4.500)

## 2021-11-01 NOTE — Telephone Encounter (Signed)
Spoke with pt and advised these HRs look fine.  Inquired about how she was feeling.  Pt feels great.  No palps, dizziness or other issues.  Advised pt to continue to monitor HR a couple of times a day and let us know if symptoms change.  Advised to continue with plan to establish care with EP.

## 2021-11-01 NOTE — Telephone Encounter (Signed)
I tried to reach Dr. Marge Duncans office though could not get a person on the phone. I will fax these notes over to their office to please see the notes from Ronie Spies, [facility].  I s/w the pt today and she does tell me that colonoscopy has been  cancelled. Pt states she needs to get her cardiac issues handled before she can proceed with the colonoscopy. I will remove from the pre op call back pool.

## 2021-11-01 NOTE — Telephone Encounter (Signed)
Reviewed office visit from yesterday. Patient was seen in office yesterday to have intermittent 3rd degree heart block by EKG. Therefore, she is not cleared to undergo colonoscopy at this time. (This was elective and non urgent per patient yesterday.) I briefly reviewed with her this may be the case but she had planned to see Dr. Eldridge Dace to discuss further eval. He stopped her beta blocker and recommended close f/u. He has also referred her to EP for evaluation. Since colonoscopy was only scheduled a few days away I will route to callback team to make sure pt aware this should be deferred at this time and to notify her requesting GI team. Patient should discuss with her cardiologist about timing when cardiac issues are more stable. Thank you!

## 2021-11-01 NOTE — Telephone Encounter (Signed)
Patient calling to give her HR update after not taking the medication. Please advise  8:30am- HR 85 9:30am- HR59 10:30am- [id number]

## 2021-11-05 ENCOUNTER — Encounter: Payer: Self-pay | Admitting: Interventional Cardiology

## 2021-11-05 DIAGNOSIS — H43813 Vitreous degeneration, bilateral: Secondary | ICD-10-CM | POA: Diagnosis not present

## 2021-11-05 DIAGNOSIS — H25013 Cortical age-related cataract, bilateral: Secondary | ICD-10-CM | POA: Diagnosis not present

## 2021-11-06 ENCOUNTER — Telehealth: Payer: Self-pay | Admitting: Interventional Cardiology

## 2021-11-06 ENCOUNTER — Ambulatory Visit: Payer: Medicare PPO

## 2021-11-06 DIAGNOSIS — I480 Paroxysmal atrial fibrillation: Secondary | ICD-10-CM | POA: Diagnosis not present

## 2021-11-06 DIAGNOSIS — R001 Bradycardia, unspecified: Secondary | ICD-10-CM

## 2021-11-06 DIAGNOSIS — I442 Atrioventricular block, complete: Secondary | ICD-10-CM

## 2021-11-06 NOTE — Telephone Encounter (Signed)
Pt advised Dr. Michaelle Copas recommendations... she has been off of the metoprolol for one week... 3 day Zio ordered... pt has appt this Friday 11/08/21 at 11:30 am arrival but aware that after his nurse reviews the schedule the time may be altered and she says she will keep the entire day open and can come anytime.

## 2021-11-06 NOTE — Progress Notes (Deleted)
z

## 2021-11-06 NOTE — Telephone Encounter (Signed)
Irving Burton with [employer] called to report that the pts Zio from 10/14/21-10/28/21 has been uploaded for the MD review.. pt has had significant symptomatic CHB... she is seeing Dr. Ladona Ridgel 12/09/21 for consult... I spoke with the pt and she reports that she thinks she has been in AFIB but started after she  drank a caffeinated soda... she says she has been a lot of ectopy since stopping the Metoprolol a week ago... Rate has been ion the 90's. She had some SOB last night when she got up to go to the bathroom but no dizziness... but a "strange" feeling when she is out of rhythm... she is very anxious...   She is going to [place] in March 14 for a large family trip for her sons 40th bday that she already has all tickets purchased for and  a family trip in June to [place] for her nieces wedding.. so she is anxious to get this taken care of and whatever plan EP has for her.Marland KitchenMarland KitchenI advised her that I will talk with our EP nurses and see about moving up her appt and call her back this afternoon.    Addendum: pt has spoke with the EP scheduler and her appt with Dr. Ladona Ridgel is now 11/25/21.

## 2021-11-06 NOTE — Telephone Encounter (Signed)
Alexandra Rogers, from [facility] calling with abnormal cardiac results on zio patch monitor.  Ref # [id number]

## 2021-11-06 NOTE — Progress Notes (Unsigned)
Enrolled for [employer] to mail a ZIO XT long term holter monitor to the patients address on file.   Cancelled patient received a pacemaker

## 2021-11-08 ENCOUNTER — Ambulatory Visit (HOSPITAL_COMMUNITY): Payer: Medicare PPO

## 2021-11-08 ENCOUNTER — Ambulatory Visit: Payer: Medicare PPO | Admitting: Interventional Cardiology

## 2021-11-08 ENCOUNTER — Ambulatory Visit (HOSPITAL_COMMUNITY)
Admission: RE | Admit: 2021-11-08 | Discharge: 2021-11-08 | Disposition: A | Discharge status: Home / Self Care | Payer: Medicare PPO | Source: Ambulatory Visit | Attending: Cardiology | Admitting: Cardiology

## 2021-11-08 ENCOUNTER — Encounter: Payer: Self-pay | Admitting: Interventional Cardiology

## 2021-11-08 ENCOUNTER — Other Ambulatory Visit: Payer: Self-pay

## 2021-11-08 ENCOUNTER — Encounter (HOSPITAL_COMMUNITY)
Admission: RE | Disposition: A | Discharge status: Home / Self Care | Payer: Medicare PPO | Source: Ambulatory Visit | Attending: Cardiology

## 2021-11-08 VITALS — BP 140/70 | HR 45 | Ht 65.5 in | Wt 280.2 lb

## 2021-11-08 DIAGNOSIS — I480 Paroxysmal atrial fibrillation: Secondary | ICD-10-CM | POA: Insufficient documentation

## 2021-11-08 DIAGNOSIS — I10 Essential (primary) hypertension: Secondary | ICD-10-CM

## 2021-11-08 DIAGNOSIS — I442 Atrioventricular block, complete: Secondary | ICD-10-CM | POA: Diagnosis not present

## 2021-11-08 DIAGNOSIS — G4733 Obstructive sleep apnea (adult) (pediatric): Secondary | ICD-10-CM | POA: Diagnosis not present

## 2021-11-08 DIAGNOSIS — Z95818 Presence of other cardiac implants and grafts: Secondary | ICD-10-CM

## 2021-11-08 DIAGNOSIS — I447 Left bundle-branch block, unspecified: Secondary | ICD-10-CM

## 2021-11-08 DIAGNOSIS — I441 Atrioventricular block, second degree: Secondary | ICD-10-CM

## 2021-11-08 DIAGNOSIS — Z7901 Long term (current) use of anticoagulants: Secondary | ICD-10-CM | POA: Insufficient documentation

## 2021-11-08 DIAGNOSIS — Z950 Presence of cardiac pacemaker: Secondary | ICD-10-CM | POA: Diagnosis not present

## 2021-11-08 HISTORY — PX: PACEMAKER IMPLANT: EP1218

## 2021-11-08 SURGERY — PACEMAKER IMPLANT

## 2021-11-08 MED ORDER — SODIUM CHLORIDE 0.9 % IV SOLN: INTRAVENOUS | Status: DC

## 2021-11-08 MED ORDER — LIDOCAINE HCL (PF) 1 % IJ SOLN
INTRAMUSCULAR | Status: DC | PRN
  Administered 2021-11-08: 20 mL
  Administered 2021-11-08: 60 mL

## 2021-11-08 MED ORDER — HEPARIN (PORCINE) IN NACL 1000-0.9 UT/500ML-% IV SOLN
INTRAVENOUS | Status: DC | PRN
  Administered 2021-11-08: 500 mL

## 2021-11-08 MED ORDER — LIDOCAINE HCL 1 % IJ SOLN
INTRAMUSCULAR | Status: AC
  Filled 2021-11-08: qty 20

## 2021-11-08 MED ORDER — CEFAZOLIN SODIUM-DEXTROSE 1-4 GM/50ML-% IV SOLN
1.0000 g | Freq: Four times a day (QID) | INTRAVENOUS | Status: DC
  Administered 2021-11-08: 1 g via INTRAVENOUS

## 2021-11-08 MED ORDER — CEFAZOLIN SODIUM-DEXTROSE 2-4 GM/100ML-% IV SOLN
INTRAVENOUS | Status: AC
  Filled 2021-11-08: qty 100

## 2021-11-08 MED ORDER — ACETAMINOPHEN 325 MG PO TABS: 325.0000 mg | ORAL_TABLET | ORAL | Status: DC | PRN

## 2021-11-08 MED ORDER — CEFAZOLIN SODIUM-DEXTROSE 2-4 GM/100ML-% IV SOLN
2.0000 g | INTRAVENOUS | Status: AC
  Administered 2021-11-08: 2 g via INTRAVENOUS

## 2021-11-08 MED ORDER — LIDOCAINE HCL 1 % IJ SOLN
INTRAMUSCULAR | Status: AC
  Filled 2021-11-08: qty 60

## 2021-11-08 MED ORDER — MIDAZOLAM HCL 5 MG/5ML IJ SOLN
INTRAMUSCULAR | Status: AC
  Filled 2021-11-08: qty 5

## 2021-11-08 MED ORDER — FENTANYL CITRATE (PF) 100 MCG/2ML IJ SOLN
INTRAMUSCULAR | Status: AC
  Filled 2021-11-08: qty 2

## 2021-11-08 MED ORDER — SODIUM CHLORIDE 0.9 % IV SOLN
80.0000 mg | INTRAVENOUS | Status: AC
  Administered 2021-11-08: 80 mg

## 2021-11-08 MED ORDER — METOPROLOL SUCCINATE ER 100 MG PO TB24: 100.0000 mg | ORAL_TABLET | Freq: Every day | ORAL | 3 refills | Status: AC

## 2021-11-08 MED ORDER — ONDANSETRON HCL 4 MG/2ML IJ SOLN: 4.0000 mg | Freq: Four times a day (QID) | INTRAMUSCULAR | Status: DC | PRN

## 2021-11-08 MED ORDER — SODIUM CHLORIDE 0.9 % IV SOLN
INTRAVENOUS | Status: AC
  Filled 2021-11-08: qty 2

## 2021-11-08 SURGICAL SUPPLY — 15 items
CABLE SURGICAL S-101-97-12 (CABLE) ×2 IMPLANT
CATH RIGHTSITE C315HIS02 (CATHETERS) ×1 IMPLANT
IPG PACE AZUR XT DR MRI W1DR01 (Pacemaker) IMPLANT
LEAD CAPSURE NOVUS 5076-52CM (Lead) ×1 IMPLANT
LEAD SELECT SECURE 3830 383069 (Lead) IMPLANT
MAT PREVALON FULL STRYKER (MISCELLANEOUS) ×1 IMPLANT
PACE AZURE XT DR MRI W1DR01 (Pacemaker) ×2 IMPLANT
PAD DEFIB RADIO PHYSIO CONN (PAD) ×2 IMPLANT
SELECT SECURE 3830 383069 (Lead) ×2 IMPLANT
SHEATH 7FR PRELUDE SNAP 13 (SHEATH) ×1 IMPLANT
SHEATH 9FR PRELUDE SNAP 13 (SHEATH) ×1 IMPLANT
SHEATH PROBE COVER 6X72 (BAG) ×1 IMPLANT
SLITTER 6232ADJ (MISCELLANEOUS) ×1 IMPLANT
TRAY PACEMAKER INSERTION (PACKS) ×2 IMPLANT
WIRE HI TORQ VERSACORE-J 145CM (WIRE) ×1 IMPLANT

## 2021-11-08 NOTE — Discharge Instructions (Signed)
After Your Pacemaker   You have a [employer] Pacemaker  ACTIVITY Do not lift your arm above shoulder height for 1 week after your procedure. After 7 days, you may progress as below.  You should remove your sling 24 hours after your procedure, unless otherwise instructed by your provider.     Friday November 15, 2021  Saturday November 16, 2021 Sunday November 17, 2021 Monday November 18, 2021   Do not lift, push, pull, or carry anything over 10 pounds with the affected arm until 6 weeks (Friday December 20, 2021 ) after your procedure.   You may drive AFTER your wound check, unless you have been told otherwise by your provider.   Ask your healthcare provider when you can go back to work   INCISION/Dressing If you are on a blood thinner such as Coumadin, Xarelto, Eliquis, Plavix, or Pradaxa please confirm with your provider when this should be resumed.   If large square, outer bandage is left in place, this can be removed after 24 hours from your procedure. Do not remove steri-strips or glue as below.   Monitor your Pacemaker site for redness, swelling, and drainage. Call the device clinic at [phone] if you experience these symptoms or fever/chills.  If your incision is sealed with Steri-strips or staples, you may shower 10 days after your procedure or when told by your provider. Do not remove the steri-strips or let the shower hit directly on your site. You may wash around your site with soap and water.    If you were discharged in a sling, please do not wear this during the day more than 48 hours after your surgery unless otherwise instructed. This may increase the risk of stiffness and soreness in your shoulder.   Avoid lotions, ointments, or perfumes over your incision until it is well-healed.  You may use a hot tub or a pool AFTER your wound check appointment if the incision is completely closed.  Pacemaker Alerts:  Some alerts are vibratory and others beep. These are NOT  emergencies. Please call our office to let us know. If this occurs at night or on weekends, it can wait until the next business day. Send a remote transmission.  If your device is capable of reading fluid status (for heart failure), you will be offered monthly monitoring to review this with you.   DEVICE MANAGEMENT Remote monitoring is used to monitor your pacemaker from home. This monitoring is scheduled every 91 days by our office. It allows [place] to keep an eye on the functioning of your device to ensure it is working properly. You will routinely see your Electrophysiologist annually (more often if necessary).   You should receive your ID card for your new device in 4-8 weeks. Keep this card with you at all times once received. Consider wearing a medical alert bracelet or necklace.  Your Pacemaker may be MRI compatible. This will be discussed at your next office visit/wound check.  You should avoid contact with strong electric or magnetic fields.   Do not use amateur (ham) radio equipment or electric (arc) welding torches. MP3 player headphones with magnets should not be used. Some devices are safe to use if held at least 12 inches (30 cm) from your Pacemaker. These include power tools, lawn mowers, and speakers. If you are unsure if something is safe to use, ask your health care provider.  When using your cell phone, hold it to the ear that is on the opposite side from the  Pacemaker. Do not leave your cell phone in a pocket over the Pacemaker.  You may safely use electric blankets, heating pads, computers, and microwave ovens.  Call the office right away if: You have chest pain. You feel more short of breath than you have felt before. You feel more light-headed than you have felt before. Your incision starts to open up.  This information is not intended to replace advice given to you by your health care provider. Make sure you discuss any questions you have with your health care provider.

## 2021-11-08 NOTE — Patient Instructions (Signed)
Medication Instructions:  Your physician recommends that you continue on your current medications as directed. Please refer to the Current Medication list given to you today.  *If you need a refill on your cardiac medications before your next appointment, please call your pharmacy*   Lab Work: None If you have labs (blood work) drawn today and your tests are completely normal, you will receive your results only by: MyChart Message (if you have MyChart) OR A paper copy in the mail If you have any lab test that is abnormal or we need to change your treatment, we will call you to review the results.   Testing/Procedures: Your physician has recommended that you have a pacemaker inserted. A pacemaker is a small device that is placed under the skin of your chest or abdomen to help control abnormal heart rhythms. This device uses electrical pulses to prompt the heart to beat at a normal rate. Pacemakers are used to treat heart rhythms that are too slow. Wire (leads) are attached to the pacemaker that goes into the chambers of you heart. This is done in the hospital and usually requires and overnight stay. Please see the instruction sheet given to you today for more information.    Follow-Up: At [facility], you and your health needs are our priority.  As part of our continuing mission to provide you with exceptional heart care, we have created designated Provider Care Teams.  These Care Teams include your primary Cardiologist (physician) and Advanced Practice Providers (APPs -  Physician Assistants and Nurse Practitioners) who all work together to provide you with the care you need, when you need it.  We recommend signing up for the patient portal called "MyChart".  Sign up information is provided on this After Visit Summary.  MyChart is used to connect with patients for Virtual Visits (Telemedicine).  Patients are able to view lab/test results, encounter notes, upcoming appointments, etc.   Non-urgent messages can be sent to your provider as well.   To learn more about what you can do with MyChart, go to [email or web address].    Your next appointment:   3-4 week(s)  The format for your next appointment:   In Person  Provider:   Lesleigh Noe, MD     Other Instructions  Please proceed to the main entrance of [facility] and check in with Admitting.  They will get you over to short stay to get you ready for Pacemaker.

## 2021-11-08 NOTE — H&P (Addendum)
ELECTROPHYSIOLOGY CONSULT NOTE    Patient ID: Alexandra Rogers MRN: [id number], DOB/AGE: 1946/05/18 76 y.o.  Admit date: (Not on file) Date of Consult: 11/08/2021  Primary Physician: Cari Caraway, MD Primary Cardiologist: Sinclair Grooms, MD  Electrophysiologist:  New to Dr. Curt Bears  Reason for Admission: Complete Heart Block  Patient Profile: Alexandra Rogers is a 76 y.o. female with a history of syncope, LBBB, normal coronaries, PAF on eliquis for CHADSVASC >2, OSA but not on CPAP who is being seen today for the evaluation of heart block at the request of Dr. Tamala Julian.  HPI:  Alexandra Rogers is a 76 y.o. female with medical history as above.   Per the office visit with Dr. Irish Lack on 10/31/2021 she was noted to be in third-degree heart block with heart rate of 38 bpm.  She was started on furosemide, metoprolol 100 mg/day was discontinued, and she seemed to feel somewhat better.  She has continued to cough despite the diuretic.     She called on 11/06/2021 and noted that her heart rate was still slow and she felt she was having episodes of atrial fibrillation.  Because of this I arranged for her to come into the office today and also had a 3-day monitor sent to her house which she has not yet started.  The EKG today demonstrated second or third degree heart block with a ventricular rate of 45 bpm.  She is feeling OK currently. No frank syncope. Very SOB walking from her car to short stay entrance. Had to stop and "huff" several times.   Past Medical History:  Diagnosis Date   Chronic anticoagulation 12/17/2016   CHADs VASc=3   Dysrhythmia    LBBB, arrythmmia   Hypertension    LBBB (left bundle branch block)    Normal coronary arteries 2004   PAF (paroxysmal atrial fibrillation) ([place]) 12/17/2016   Shortness of breath    on exertion due to weight     Surgical History:  Past Surgical History:  Procedure Laterality Date   APPENDECTOMY     BREAST EXCISIONAL BIOPSY  Right    BREAST SURGERY     CARDIOVERSION  12/17/2016   in ED for 2:1 A flutter with LBBB   CHOLECYSTECTOMY     HYSTEROSCOPY WITH D & C  09/22/2012   Procedure: DILATATION AND CURETTAGE /HYSTEROSCOPY;  Surgeon: Thurnell Lose, MD;  Location: [facility];  Service: Gynecology;  Laterality: N/A;   LEFT HEART CATH  2004   Normal coronaries   SHOULDER SURGERY     VAGINAL DELIVERY  1978. 1983   WRIST FRACTURE SURGERY       No medications prior to admission.    Inpatient Medications:   Allergies: No Known Allergies  Social History   Socioeconomic History   Marital status: Married    Spouse name: Not on file   Number of children: Not on file   Years of education: Not on file   Highest education level: Not on file  Occupational History   Not on file  Tobacco Use   Smoking status: Former   Smokeless tobacco: Never  Vaping Use   Vaping Use: Never used  Substance and Sexual Activity   Alcohol use: Yes    Comment: wine daily   Drug use: No   Sexual activity: Not on file  Other Topics Concern   Not on file  Social History Narrative   Not on file   Social Determinants of Health   Financial  Resource Strain: Not on file  Food Insecurity: Not on file  Transportation Needs: Not on file  Physical Activity: Not on file  Stress: Not on file  Social Connections: Not on file  Intimate Partner Violence: Not on file     Family History  Problem Relation Age of Onset   Crohn's disease Mother    Pancreatic cancer Father      Review of Systems: All other systems reviewed and are otherwise negative except as noted above.  Physical Exam: BP 140/70 HR 45 Weight 280 lbs SpO2 97%  GEN- The patient is well appearing, alert and oriented x 3 today.   HEENT: normocephalic, atraumatic; sclera clear, conjunctiva pink; hearing intact; oropharynx clear; neck supple Lungs- Clear to ausculation bilaterally, normal work of breathing.  No wheezes, rales, rhonchi Heart- slow, but regular rate  and rhythm, no murmurs, rubs or gallops GI- soft, non-tender, non-distended, bowel sounds present Extremities- no clubbing, cyanosis, or edema; DP/PT/radial pulses 2+ bilaterally MS- no significant deformity or atrophy Skin- warm and dry, no rash or lesion Psych- euthymic mood, full affect Neuro- strength and sensation are intact  Labs:   Lab Results  Component Value Date   WBC 9.1 10/31/2021   HGB 14.3 10/31/2021   HCT 42.4 10/31/2021   MCV 95 10/31/2021   PLT 315 10/31/2021   No results for input(s): NA, K, CL, CO2, BUN, CREATININE, CALCIUM, PROT, BILITOT, ALKPHOS, ALT, AST, GLUCOSE in the last 168 hours.  Invalid input(s): LABALBU    Radiology/Studies: LONG TERM MONITOR (3-14 DAYS)  Result Date: 11/06/2021  Basic rhythm is normal SR and SB  Frequent high grade AV block, Mobitz II and third degree.  Metoprolol discontinued 11/01/2021.  Perior of monitoring ended 10/28/2021  Recommend 3 day monitor ASAP, off Metoprolol. Arrange OV and ECG with me on Friday. Patch Wear Time:  13 days and 23 hours (2022-12-19T11:08:54-0500 to 2023-01-02T10:40:54-0500) Patient had a min HR of 22 bpm, max HR of 171 bpm, and avg HR of 42 bpm. Predominant underlying rhythm was Sinus Rhythm. 3 Ventricular Tachycardia runs occurred, the run with the fastest interval lasting 8 beats with a max rate of 146 bpm (avg 129 bpm); the run with the fastest interval was also the longest. 3 Supraventricular Tachycardia runs occurred, the run with the fastest interval lasting 12.8 secs with a max rate of 171 bpm (avg 143 bpm); the run with the fastest interval was also the longest. 2 Pauses occurred, the longest lasting 4.7 secs (14 bpm). Pauses occurred due to High Grade AV Block. 60109 episode(s) of AV Block (2nd Mobitz II and 3rd) occurred, lasting a total of 11 days 17 hours. Pause, AV Block (2nd Mobitz II, High Grade) and AV Block (3rd) were detected within +/- 45 seconds of symptomatic patient event(s). Isolated  SVEs were rare (<1.0%), SVE Couplets were rare (<1.0%), and SVE Triplets were rare (<1.0%). Isolated VEs were rare (<1.0%, 2313), VE Couplets were rare (<1.0%,  1428), and VE Triplets were rare (<1.0%, 3). Ventricular Bigeminy was present. MD notification criteria for Complete Heart Block, Symptomatic Second Degree AV Block, Mobitz II, and Symptomatic High Grade AV Block (4.7 seconds of Ventricular Asystole) met - report posted prior to notification per account request (KTA).    EKG:11/01/2020 shows CHB with wide escape (personally reviewed)  Assessment/Plan: 1.  Intermittent Complete Heart Block/Advanced AV block Persists despite BB washout x 8 days.  Echo 01/2020 LVEF 60-65% Monitor pre BB washout showed CHB and second degree AV block Explained  risks, benefits, and alternatives to PPM implantation, including but not limited to bleeding, infection, pneumothorax, pericardial effusion, lead dislodgement, heart attack, stroke, or death.  Pt verbalized understanding and agrees to proceed if indicated.   2. Paroxysmal atrial fibrillation Resumption of Eliquis timing pending course.   For questions or updates, please contact [place] Please consult [email or web address] for contact info under Cardiology/STEMI.  Signed, Shirley Friar, PA-C  11/08/2021 1:04 PM   I have seen and examined this patient with Oda Kilts.  Agree with above, note added to reflect my findings.  Patient presented to the cardiology clinic in 2-1 AV block.  She had previously been in complete heart block, but metoprolol was stopped.  She did have improvement in her conduction.  She does continue to feel short of breath with exertion.  No reversible causes been found.  GEN: Well nourished, well developed, in no acute distress  HEENT: normal  Neck: no JVD, carotid bruits, or masses Cardiac: Bradycardic; no murmurs, rubs, or gallops,no edema  Respiratory:  clear to auscultation bilaterally, normal work of breathing GI:  soft, nontender, nondistended, + BS MS: no deformity or atrophy  Skin: warm and dry Neuro:  Strength and sensation are intact Psych: euthymic mood, full affect   Second-degree AV block: No reversible cause.  Beta-blocker has washed out.  She would benefit from pacemaker implant.  Risks and benefits of been discussed which include bleeding, infection, tamponade, pneumothorax.  Patient understands these risks and has agreed to the procedure. Paroxysmal atrial fibrillation: Resume Eliquis after implant.  We Greysen Devino restart metoprolol.  Nadezhda Pollitt M. Isaic Syler MD 11/08/2021 1:56 PM

## 2021-11-08 NOTE — Interval H&P Note (Signed)
History and Physical Interval Note:  11/08/2021 2:46 PM  Alexandra Rogers  has presented today for surgery, with the diagnosis of complete heart block.  The various methods of treatment have been discussed with the patient and family. After consideration of risks, benefits and other options for treatment, the patient has consented to  Procedure(s): PACEMAKER IMPLANT (N/A) as a surgical intervention.  The patient's history has been reviewed, patient examined, no change in status, stable for surgery.  I have reviewed the patient's chart and labs.  Questions were answered to the patient's satisfaction.     Graciela Plato [employer]

## 2021-11-08 NOTE — Progress Notes (Signed)
Cardiology Office Note:    Date:  11/08/2021   ID:  Alexandra Rogers, DOB 05/23/1946, MRN [id number]  PCP:  Cari Caraway, MD  Cardiologist:  Sinclair Grooms, MD   Referring MD: Cari Caraway, MD   Chief Complaint  Patient presents with   Advice Only    Heart block History of A. Fib Diastolic heart failure    History of Present Illness:    Alexandra Rogers is a 76 y.o. female with a hx of  syncope, LBBB,  normal coronaries, PAF (with prior cardioversion due to persistent atrial flutter) CHADS VASC > 2, obstructive sleep apnea but not on CPAP (never had a titration study) and chronic anticoagulation therapy with Eliquis 5 mg twice daily.  Per the office visit with Dr. Irish Lack on 10/31/2021 she was noted to be in third-degree heart block with heart rate of 38 bpm.  She was started on furosemide, metoprolol 100 mg/day was discontinued, and she seemed to feel somewhat better.  She has continued to cough despite the diuretic.    She called on 11/06/2021 and noted that her heart rate was still slow and she felt she was having episodes of atrial fibrillation.  Because of this I arranged for her to come into the office today and also had a 3-day monitor sent to her house which she has not yet started.  The EKG today demonstrated second or third degree heart block with a ventricular rate of 45 bpm.  She ambulated into the room.  She is coughing frequently.  She is breathless.  Her digital device has not shown any rapid heartbeats.  She has had occasional normal conduction but frequent episodes of AV block and bradycardia.  Past Medical History:  Diagnosis Date   Chronic anticoagulation 12/17/2016   CHADs VASc=3   Dysrhythmia    LBBB, arrythmmia   Hypertension    LBBB (left bundle branch block)    Normal coronary arteries 2004   PAF (paroxysmal atrial fibrillation) ([place]) 12/17/2016   Shortness of breath    on exertion due to weight    Past Surgical History:  Procedure  Laterality Date   APPENDECTOMY     BREAST EXCISIONAL BIOPSY Right    BREAST SURGERY     CARDIOVERSION  12/17/2016   in ED for 2:1 A flutter with LBBB   CHOLECYSTECTOMY     HYSTEROSCOPY WITH D & C  09/22/2012   Procedure: DILATATION AND CURETTAGE /HYSTEROSCOPY;  Surgeon: Thurnell Lose, MD;  Location: [facility];  Service: Gynecology;  Laterality: N/A;   LEFT HEART CATH  2004   Normal coronaries   SHOULDER SURGERY     VAGINAL DELIVERY  1978. 1983   WRIST FRACTURE SURGERY      Current Medications: Current Meds  Medication Sig   Acetaminophen (TYLENOL ARTHRITIS PAIN PO) Take 2 capsules by mouth daily.   apixaban (ELIQUIS) 5 MG TABS tablet Take 1 tablet (5 mg total) by mouth 2 (two) times daily.   calcium-vitamin D (OSCAL WITH D) 500-200 MG-UNIT per tablet Take 1 tablet by mouth daily.   Cholecalciferol (VITAMIN D) 2000 UNITS tablet Take 2,000 Units by mouth daily.   furosemide (LASIX) 40 MG tablet Take one tablet by mouth daily as directed   Multiple Vitamin (MULTIVITAMIN WITH MINERALS) TABS Take 1 tablet by mouth daily.   naftifine (NAFTIN) 1 % cream Apply 1 application topically daily as needed. (ring worm)   progesterone (PROMETRIUM) 100 MG capsule Take 100 mg by mouth daily.  rosuvastatin (CRESTOR) 5 MG tablet Take 5 mg by mouth daily.   valsartan-hydrochlorothiazide (DIOVAN-HCT) 320-12.5 MG tablet Take 1 tablet by mouth daily.     Allergies:   Patient has no known allergies.   Social History   Socioeconomic History   Marital status: Married    Spouse name: Not on file   Number of children: Not on file   Years of education: Not on file   Highest education level: Not on file  Occupational History   Not on file  Tobacco Use   Smoking status: Former   Smokeless tobacco: Never  Vaping Use   Vaping Use: Never used  Substance and Sexual Activity   Alcohol use: Yes    Comment: wine daily   Drug use: No   Sexual activity: Not on file  Other Topics Concern   Not on file   Social History Narrative   Not on file   Social Determinants of Health   Financial Resource Strain: Not on file  Food Insecurity: Not on file  Transportation Needs: Not on file  Physical Activity: Not on file  Stress: Not on file  Social Connections: Not on file     Family History: The patient's family history includes Crohn's disease in her mother; Pancreatic cancer in her father.  ROS:   Please see the history of present illness.    Compliant with CPAP.  Has not had syncope.  Denies lower extremity swelling.  Has a trip to [place] and to [place] planned for later this year.  All other systems reviewed and are negative.  EKGs/Labs/Other Studies Reviewed:    The following studies were reviewed today: 3-week long-term monitor completed on November 06, 2020: Study Highlights    Basic rhythm is normal SR and SB Frequent high grade AV block, Mobitz II and third degree. Metoprolol discontinued 11/01/2021. Perior of monitoring ended 10/28/2021   Recommend 3 day monitor ASAP, off Metoprolol. Arrange OV and ECG with me on Friday.         Patch Wear Time:  13 days and 23 hours (2022-12-19T11:08:54-0500 to 2023-01-02T10:40:54-0500)   Patient had a min HR of 22 bpm, max HR of 171 bpm, and avg HR of 42 bpm. Predominant underlying rhythm was Sinus Rhythm. 3 Ventricular Tachycardia runs occurred, the run with the fastest interval lasting 8 beats with a max rate of 146 bpm (avg 129 bpm);  the run with the fastest interval was also the longest. 3 Supraventricular Tachycardia runs occurred, the run with the fastest interval lasting 12.8 secs with a max rate of 171 bpm (avg 143 bpm); the run with the fastest interval was also the longest. 2  Pauses occurred, the longest lasting 4.7 secs (14 bpm). Pauses occurred due to High Grade AV Block. 32202 episode(s) of AV Block (2nd Mobitz II and 3rd) occurred, lasting a total of 11 days 17 hours. Pause, AV Block (2nd Mobitz II, High Grade) and  AV  Block (3rd) were detected within +/- 45 seconds of symptomatic patient event(s). Isolated SVEs were rare (<1.0%), SVE Couplets were rare (<1.0%), and SVE Triplets were rare (<1.0%). Isolated VEs were rare (<1.0%, 2313), VE Couplets were rare (<1.0%,  1428), and VE Triplets were rare (<1.0%, 3). Ventricular Bigeminy was present. MD notification criteria for Complete Heart Block, Symptomatic Second Degree AV Block, Mobitz II, and Symptomatic High Grade AV Block (4.7 seconds of Ventricular Asystole)  met - report posted prior to notification per account request (KTA).  EKG:  EKG sinus rhythm  with 2-1 AV conduction versus third-degree heart block and incomplete right bundle branch block ventricular conduction.  Recent Labs: 10/31/2021: ALT 15; BUN 20; Creatinine, Ser 1.04; Hemoglobin 14.3; Platelets 315; Potassium 4.3; Sodium 141; TSH 2.940  Recent Lipid Panel No results found for: CHOL, TRIG, HDL, CHOLHDL, VLDL, LDLCALC, LDLDIRECT  Physical Exam:    VS:  BP 140/70    Pulse (!) 45    Ht 5' 5.5" (1.664 m)    Wt 280 lb 3.2 oz (127.1 kg)    SpO2 97%    BMI 45.92 kg/m     Wt Readings from Last 3 Encounters:  11/08/21 280 lb 3.2 oz (127.1 kg)  10/31/21 279 lb (126.6 kg)  03/26/21 284 lb 9.6 oz (129.1 kg)     GEN: Morbid obesity. No acute distress HEENT: Normal NECK: No JVD. LYMPHATICS: No lymphadenopathy CARDIAC: No murmur.  Bradycardic with regular rhythm and no gallop, or edema. VASCULAR:  Normal Pulses. No bruits. RESPIRATORY:  Clear to auscultation without rales, wheezing or rhonchi  ABDOMEN: Soft, non-tender, non-distended, No pulsatile mass, MUSCULOSKELETAL: No deformity  SKIN: Warm and dry NEUROLOGIC:  Alert and oriented x 3 PSYCHIATRIC:  Normal affect   ASSESSMENT:    1. Heart block AV complete ([place])   2. PAF (paroxysmal atrial fibrillation) ([place])   3. LBBB (left bundle branch block)   4. Essential hypertension   5. Obstructive sleep apnea   6. Chronic anticoagulation     PLAN:    In order of problems listed above:  Despite being off metoprolol for 8 days, her wearable digital device and EKG in the office demonstrated high-grade AV block.  She is symptomatic with decreased exertional tolerance and dyspnea on exertion.  She has orthopnea and coughing with assuming a supine position.  She needs to have permanent pacemaker implantation to solve this problem.  Have spoken with the EP service, Dr. Curt Bears.  Dr. Curt Bears has agreed to see the patient and perform the pacemaker today.  She has eaten breakfast and had Eliquis today.  She understands the risks of the procedure could include cardiac perforation, bleeding, infection, among other complications.  Husband was present during discussion. Continue long-term anticoagulation therapy. Prior history of bundle branch block.  Left bundle was not present on today's EKG. Blood pressure 140/70 mmHg with a heart rate of 45 bpm. Did not discuss Continue Eliquis   Prolonged office visit, greater than 65 minutes, with greater than 50% of the time spent in conversation/education concerning underlying disease process, management strategy, arranging urgent pacemaker therapy, and describing the risks versus benefit.  Medication Adjustments/Labs and Tests Ordered: Current medicines are reviewed at length with the patient today.  Concerns regarding medicines are outlined above.  Orders Placed This Encounter  Procedures   EKG 12-Lead   No orders of the defined types were placed in this encounter.   Patient Instructions  Medication Instructions:  Your physician recommends that you continue on your current medications as directed. Please refer to the Current Medication list given to you today.  *If you need a refill on your cardiac medications before your next appointment, please call your pharmacy*   Lab Work: None If you have labs (blood work) drawn today and your tests are completely normal, you will receive your  results only by: [place] (if you have MyChart) OR A paper copy in the mail If you have any lab test that is abnormal or we need to change your treatment, we will call you to review  the results.   Testing/Procedures: Your physician has recommended that you have a pacemaker inserted. A pacemaker is a small device that is placed under the skin of your chest or abdomen to help control abnormal heart rhythms. This device uses electrical pulses to prompt the heart to beat at a normal rate. Pacemakers are used to treat heart rhythms that are too slow. Wire (leads) are attached to the pacemaker that goes into the chambers of you heart. This is done in the hospital and usually requires and overnight stay. Please see the instruction sheet given to you today for more information.    Follow-Up: At [facility], you and your health needs are our priority.  As part of our continuing mission to provide you with exceptional heart care, we have created designated Provider Care Teams.  These Care Teams include your primary Cardiologist (physician) and Advanced Practice Providers (APPs -  Physician Assistants and Nurse Practitioners) who all work together to provide you with the care you need, when you need it.  We recommend signing up for the patient portal called "MyChart".  Sign up information is provided on this After Visit Summary.  MyChart is used to connect with patients for Virtual Visits (Telemedicine).  Patients are able to view lab/test results, encounter notes, upcoming appointments, etc.  Non-urgent messages can be sent to your provider as well.   To learn more about what you can do with MyChart, go to [email or web address].    Your next appointment:   3-4 week(s)  The format for your next appointment:   In Person  Provider:   Sinclair Grooms, MD     Other Instructions  Please proceed to the main entrance of [facility] and check in with Admitting.  They will get you over to  short stay to get you ready for Pacemaker.     Signed, Sinclair Grooms, MD  11/08/2021 1:01 PM    [facility]

## 2021-11-11 ENCOUNTER — Encounter (HOSPITAL_COMMUNITY): Payer: Self-pay | Admitting: Cardiology

## 2021-11-13 ENCOUNTER — Encounter: Payer: Self-pay | Admitting: Cardiology

## 2021-11-13 DIAGNOSIS — Z0000 Encounter for general adult medical examination without abnormal findings: Secondary | ICD-10-CM | POA: Diagnosis not present

## 2021-11-13 DIAGNOSIS — Z1389 Encounter for screening for other disorder: Secondary | ICD-10-CM | POA: Diagnosis not present

## 2021-11-20 ENCOUNTER — Ambulatory Visit (INDEPENDENT_AMBULATORY_CARE_PROVIDER_SITE_OTHER): Payer: Medicare PPO

## 2021-11-20 ENCOUNTER — Other Ambulatory Visit: Payer: Self-pay

## 2021-11-20 DIAGNOSIS — I441 Atrioventricular block, second degree: Secondary | ICD-10-CM

## 2021-11-20 LAB — CUP PACEART INCLINIC DEVICE CHECK
Battery Remaining Longevity: 147 mo
Battery Voltage: 3.21 V
Brady Statistic AP VP Percent: 27.99 %
Brady Statistic AP VS Percent: 1.78 %
Brady Statistic AS VP Percent: 38.31 %
Brady Statistic AS VS Percent: 31.92 %
Brady Statistic RA Percent Paced: 29.81 %
Brady Statistic RV Percent Paced: 66.3 %
Date Time Interrogation Session: 20230125085416
Implantable Lead Implant Date: 20230113
Implantable Lead Implant Date: 20230113
Implantable Lead Location: 753859
Implantable Lead Location: 753860
Implantable Lead Model: 3830
Implantable Lead Model: 5076
Implantable Pulse Generator Implant Date: 20230113
Lead Channel Impedance Value: 361 Ohm
Lead Channel Impedance Value: 418 Ohm
Lead Channel Impedance Value: 513 Ohm
Lead Channel Impedance Value: 532 Ohm
Lead Channel Pacing Threshold Amplitude: 0.5 V
Lead Channel Pacing Threshold Amplitude: 0.75 V
Lead Channel Pacing Threshold Pulse Width: 0.4 ms
Lead Channel Pacing Threshold Pulse Width: 0.4 ms
Lead Channel Sensing Intrinsic Amplitude: 4.75 mV
Lead Channel Sensing Intrinsic Amplitude: 5 mV
Lead Channel Sensing Intrinsic Amplitude: 6.375 mV
Lead Channel Sensing Intrinsic Amplitude: 7.25 mV
Lead Channel Setting Pacing Amplitude: 3.5 V
Lead Channel Setting Pacing Amplitude: 3.5 V
Lead Channel Setting Pacing Pulse Width: 0.4 ms
Lead Channel Setting Sensing Sensitivity: 1.2 mV

## 2021-11-20 NOTE — Patient Instructions (Signed)

## 2021-11-20 NOTE — Progress Notes (Signed)
Wound check appointment. Steri-strips removed. Wound without redness or edema. Incision edges approximated, wound well healed. Normal device function. Thresholds, sensing, and impedances consistent with implant measurements. Device programmed at 3.5V/auto capture programmed on for extra safety margin until 3 month visit. Histogram distribution appropriate for patient and level of activity. No mode switches 1 NSVT episode 10 seconds in duration average V rate 160bom. Patient educated about wound care, arm mobility, lifting restrictions. ROV 02/27/22 with WC

## 2021-11-25 ENCOUNTER — Institutional Professional Consult (permissible substitution): Payer: Medicare PPO | Admitting: Internal Medicine

## 2021-11-26 ENCOUNTER — Ambulatory Visit: Payer: Medicare PPO

## 2021-11-26 ENCOUNTER — Ambulatory Visit
Admission: RE | Admit: 2021-11-26 | Discharge: 2021-11-26 | Disposition: A | Discharge status: Home / Self Care | Payer: Medicare PPO | Source: Ambulatory Visit | Attending: Family Medicine | Admitting: Family Medicine

## 2021-11-26 DIAGNOSIS — M8580 Other specified disorders of bone density and structure, unspecified site: Secondary | ICD-10-CM

## 2021-11-26 DIAGNOSIS — M85851 Other specified disorders of bone density and structure, right thigh: Secondary | ICD-10-CM | POA: Diagnosis not present

## 2021-11-26 DIAGNOSIS — Z780 Asymptomatic menopausal state: Secondary | ICD-10-CM | POA: Diagnosis not present

## 2021-12-06 DIAGNOSIS — Z79899 Other long term (current) drug therapy: Secondary | ICD-10-CM | POA: Diagnosis not present

## 2021-12-06 DIAGNOSIS — I10 Essential (primary) hypertension: Secondary | ICD-10-CM | POA: Diagnosis not present

## 2021-12-06 DIAGNOSIS — R7303 Prediabetes: Secondary | ICD-10-CM | POA: Diagnosis not present

## 2021-12-06 DIAGNOSIS — E785 Hyperlipidemia, unspecified: Secondary | ICD-10-CM | POA: Diagnosis not present

## 2021-12-06 DIAGNOSIS — R7309 Other abnormal glucose: Secondary | ICD-10-CM | POA: Diagnosis not present

## 2021-12-07 NOTE — Progress Notes (Signed)
Cardiology Office Note:    Date:  12/09/2021   ID:  Alexandra Rogers, DOB 27-Aug-1946, MRN [id number]  PCP:  Cari Caraway, MD  Cardiologist:  Sinclair Grooms, MD   Referring MD: Cari Caraway, MD   Chief Complaint  Patient presents with   Advice Only    Pacemaker implantation Tachycardia-bradycardia syndrome Obstructive sleep apnea    History of Present Illness:    Alexandra Rogers is a 76 y.o. female with a hx of syncope, LBBB,  normal coronaries, PAF (with prior cardioversion due to persistent atrial flutter) CHADS VASC > 2, obstructive sleep apnea but not on CPAP (never had a titration study) and chronic anticoagulation therapy with Eliquis 5 mg twice daily.  Interval PPM 11/08/2021, Dr. Allegra Lai..  She is doing well.  She feels much better now than she did previously.  Energy has returned.  No episodes of syncope.  Denies chills and fever.  Past Medical History:  Diagnosis Date   Chronic anticoagulation 12/17/2016   CHADs VASc=3   Dysrhythmia    LBBB, arrythmmia   Hypertension    LBBB (left bundle branch block)    Normal coronary arteries 2004   PAF (paroxysmal atrial fibrillation) ([place]) 12/17/2016   Shortness of breath    on exertion due to weight    Past Surgical History:  Procedure Laterality Date   APPENDECTOMY     BREAST EXCISIONAL BIOPSY Right    BREAST SURGERY     CARDIOVERSION  12/17/2016   in ED for 2:1 A flutter with LBBB   CHOLECYSTECTOMY     HYSTEROSCOPY WITH D & C  09/22/2012   Procedure: DILATATION AND CURETTAGE /HYSTEROSCOPY;  Surgeon: Thurnell Lose, MD;  Location: [facility];  Service: Gynecology;  Laterality: N/A;   LEFT HEART CATH  2004   Normal coronaries   PACEMAKER IMPLANT N/A 11/08/2021   Procedure: PACEMAKER IMPLANT;  Surgeon: Constance Haw, MD;  Location: [facility];  Service: Cardiovascular;  Laterality: N/A;   SHOULDER SURGERY     VAGINAL DELIVERY  1978. 1983   WRIST FRACTURE SURGERY      Current  Medications: Current Meds  Medication Sig   Acetaminophen (TYLENOL ARTHRITIS PAIN PO) Take 2 capsules by mouth daily.   apixaban (ELIQUIS) 5 MG TABS tablet Take 1 tablet (5 mg total) by mouth 2 (two) times daily.   calcium-vitamin D (OSCAL WITH D) 500-200 MG-UNIT per tablet Take 1 tablet by mouth daily.   Cholecalciferol (VITAMIN D) 2000 UNITS tablet Take 2,000 Units by mouth daily.   furosemide (LASIX) 40 MG tablet Take one tablet by mouth daily as directed   metoprolol succinate (TOPROL XL) 100 MG 24 hr tablet Take 1 tablet (100 mg total) by mouth daily. Take with or immediately following a meal.   Multiple Vitamin (MULTIVITAMIN WITH MINERALS) TABS Take 1 tablet by mouth daily.   naftifine (NAFTIN) 1 % cream Apply 1 application topically daily as needed. (ring worm)   progesterone (PROMETRIUM) 100 MG capsule Take 100 mg by mouth daily.   rosuvastatin (CRESTOR) 5 MG tablet Take 5 mg by mouth daily.   valsartan-hydrochlorothiazide (DIOVAN-HCT) 320-12.5 MG tablet Take 1 tablet by mouth daily.     Allergies:   Patient has no known allergies.   Social History   Socioeconomic History   Marital status: Married    Spouse name: Not on file   Number of children: Not on file   Years of education: Not on file  Highest education level: Not on file  Occupational History   Not on file  Tobacco Use   Smoking status: Former   Smokeless tobacco: Never  Vaping Use   Vaping Use: Never used  Substance and Sexual Activity   Alcohol use: Yes    Comment: wine daily   Drug use: No   Sexual activity: Not on file  Other Topics Concern   Not on file  Social History Narrative   Not on file   Social Determinants of Health   Financial Resource Strain: Not on file  Food Insecurity: Not on file  Transportation Needs: Not on file  Physical Activity: Not on file  Stress: Not on file  Social Connections: Not on file     Family History: The patient's family history includes Crohn's disease in her  mother; Pancreatic cancer in her father.  ROS:   Please see the history of present illness.    Feels she has healed well at the pacemaker site in the left side clavicular area.  All other systems reviewed and are negative.  EKGs/Labs/Other Studies Reviewed:    The following studies were reviewed today: No new data  EKG:  EKG performed 11/08/2021 demonstrates sinus rhythm at a rate of 63 without obvious pacing spikes except in V2 there is a question   Recent Labs: 10/31/2021: ALT 15; BUN 20; Creatinine, Ser 1.04; Hemoglobin 14.3; Platelets 315; Potassium 4.3; Sodium 141; TSH 2.940  Recent Lipid Panel No results found for: CHOL, TRIG, HDL, CHOLHDL, VLDL, LDLCALC, LDLDIRECT  Physical Exam:    VS:  BP 118/78    Pulse 67    Ht 5' 5.5" (1.664 m)    Wt 278 lb 11.2 oz (126.4 kg)    SpO2 97%    BMI 45.67 kg/m     Wt Readings from Last 3 Encounters:  12/09/21 278 lb 11.2 oz (126.4 kg)  11/08/21 280 lb (127 kg)  11/08/21 280 lb 3.2 oz (127.1 kg)     GEN: Obese. No acute distress HEENT: Normal NECK: No JVD. LYMPHATICS: No lymphadenopathy CARDIAC: No murmur. RRR no gallop, or edema. VASCULAR:  Normal Pulses. No bruits. RESPIRATORY:  Clear to auscultation without rales, wheezing or rhonchi  ABDOMEN: Soft, non-tender, non-distended, No pulsatile mass, MUSCULOSKELETAL: No deformity  SKIN: Warm and dry NEUROLOGIC:  Alert and oriented x 3 PSYCHIATRIC:  Normal affect   ASSESSMENT:    1. Heart block AV complete ([place])   2. Pacemaker   3. Tachycardia-bradycardia syndrome ([place])   4. Chronic anticoagulation   5. LBBB (left bundle branch block)   6. Obstructive sleep apnea   7. PAF (paroxysmal atrial fibrillation) ([facility])    PLAN:    In order of problems listed above:  Resolved now with permanent pacemaker.  Heart rate is above 60. DDD pacemaker set at 60 bpm. Now being managed with pacemaker and beta-blocker therapy.  Telemetry within pacemaker unit will give [place] a sense of whether atrial  fib is present or not. Continue Eliquis therapy. Not a particular issue at this point Continue CPAP Continue monitoring for atrial for and chronic anticoagulation therapy due to high CHA2DS2-VASc score.   Medication Adjustments/Labs and Tests Ordered: Current medicines are reviewed at length with the patient today.  Concerns regarding medicines are outlined above.  No orders of the defined types were placed in this encounter.  No orders of the defined types were placed in this encounter.   Patient Instructions  Medication Instructions:  Your physician recommends that you  continue on your current medications as directed. Please refer to the Current Medication list given to you today.  *If you need a refill on your cardiac medications before your next appointment, please call your pharmacy*   Lab Work: NONE If you have labs (blood work) drawn today and your tests are completely normal, you will receive your results only by: [place] (if you have MyChart) OR A paper copy in the mail If you have any lab test that is abnormal or we need to change your treatment, we will call you to review the results.   Testing/Procedures: NONE   Follow-Up: At [facility], you and your health needs are our priority.  As part of our continuing mission to provide you with exceptional heart care, we have created designated Provider Care Teams.  These Care Teams include your primary Cardiologist (physician) and Advanced Practice Providers (APPs -  Physician Assistants and Nurse Practitioners) who all work together to provide you with the care you need, when you need it.  We recommend signing up for the patient portal called "MyChart".  Sign up information is provided on this After Visit Summary.  MyChart is used to connect with patients for Virtual Visits (Telemedicine).  Patients are able to view lab/test results, encounter notes, upcoming appointments, etc.  Non-urgent messages can be sent to  your provider as well.   To learn more about what you can do with MyChart, go to [email or web address].    Your next appointment:   6 month(s)  The format for your next appointment:   In Person  Provider:   Sinclair Grooms, MD      Signed, Sinclair Grooms, MD  12/09/2021 3:26 PM    [place]

## 2021-12-09 ENCOUNTER — Institutional Professional Consult (permissible substitution): Payer: Medicare PPO | Admitting: Internal Medicine

## 2021-12-09 ENCOUNTER — Encounter: Payer: Self-pay | Admitting: Interventional Cardiology

## 2021-12-09 ENCOUNTER — Ambulatory Visit: Payer: Medicare PPO | Admitting: Interventional Cardiology

## 2021-12-09 VITALS — BP 118/78 | HR 67 | Ht 65.5 in | Wt 278.7 lb

## 2021-12-09 DIAGNOSIS — G4733 Obstructive sleep apnea (adult) (pediatric): Secondary | ICD-10-CM

## 2021-12-09 DIAGNOSIS — I442 Atrioventricular block, complete: Secondary | ICD-10-CM

## 2021-12-09 DIAGNOSIS — Z7901 Long term (current) use of anticoagulants: Secondary | ICD-10-CM | POA: Diagnosis not present

## 2021-12-09 DIAGNOSIS — I447 Left bundle-branch block, unspecified: Secondary | ICD-10-CM

## 2021-12-09 DIAGNOSIS — Z950 Presence of cardiac pacemaker: Secondary | ICD-10-CM

## 2021-12-09 DIAGNOSIS — I480 Paroxysmal atrial fibrillation: Secondary | ICD-10-CM

## 2021-12-09 DIAGNOSIS — I495 Sick sinus syndrome: Secondary | ICD-10-CM | POA: Diagnosis not present

## 2021-12-09 NOTE — Patient Instructions (Signed)
Medication Instructions:  °Your physician recommends that you continue on your current medications as directed. Please refer to the Current Medication list given to you today. ° °*If you need a refill on your cardiac medications before your next appointment, please call your pharmacy* ° ° °Lab Work: °NONE °If you have labs (blood work) drawn today and your tests are completely normal, you will receive your results only by: °MyChart Message (if you have MyChart) OR °A paper copy in the mail °If you have any lab test that is abnormal or we need to change your treatment, we will call you to review the results. ° ° °Testing/Procedures: °NONE ° ° °Follow-Up: °At [facility], you and your health needs are our priority.  As part of our continuing mission to provide you with exceptional heart care, we have created designated Provider Care Teams.  These Care Teams include your primary Cardiologist (physician) and Advanced Practice Providers (APPs -  Physician Assistants and Nurse Practitioners) who all work together to provide you with the care you need, when you need it. ° °We recommend signing up for the patient portal called "MyChart".  Sign up information is provided on this After Visit Summary.  MyChart is used to connect with patients for Virtual Visits (Telemedicine).  Patients are able to view lab/test results, encounter notes, upcoming appointments, etc.  Non-urgent messages can be sent to your provider as well.   °To learn more about what you can do with MyChart, go to [email or web address].   ° °Your next appointment:   °6 month(s) ° °The format for your next appointment:   °In Person ° °Provider:   °Henry W Smith III, MD  °

## 2021-12-10 DIAGNOSIS — G4733 Obstructive sleep apnea (adult) (pediatric): Secondary | ICD-10-CM | POA: Diagnosis not present

## 2021-12-10 DIAGNOSIS — R7303 Prediabetes: Secondary | ICD-10-CM | POA: Diagnosis not present

## 2021-12-10 DIAGNOSIS — D6869 Other thrombophilia: Secondary | ICD-10-CM | POA: Diagnosis not present

## 2021-12-10 DIAGNOSIS — E7800 Pure hypercholesterolemia, unspecified: Secondary | ICD-10-CM | POA: Diagnosis not present

## 2021-12-10 DIAGNOSIS — Z950 Presence of cardiac pacemaker: Secondary | ICD-10-CM | POA: Diagnosis not present

## 2021-12-10 DIAGNOSIS — I480 Paroxysmal atrial fibrillation: Secondary | ICD-10-CM | POA: Diagnosis not present

## 2021-12-10 DIAGNOSIS — K219 Gastro-esophageal reflux disease without esophagitis: Secondary | ICD-10-CM | POA: Diagnosis not present

## 2021-12-10 DIAGNOSIS — I10 Essential (primary) hypertension: Secondary | ICD-10-CM | POA: Diagnosis not present

## 2021-12-24 DIAGNOSIS — M170 Bilateral primary osteoarthritis of knee: Secondary | ICD-10-CM | POA: Diagnosis not present

## 2022-01-17 ENCOUNTER — Telehealth: Payer: Self-pay | Admitting: *Deleted

## 2022-01-17 NOTE — Telephone Encounter (Signed)
? ?  Pre-operative Risk Assessment  ?  ?Patient Name: Alexandra Rogers  ?DOB: 02/19/1946 ?MRN: [id number]  ? ?  ? ?Request for Surgical Clearance   ? ?Procedure:   COLONOSCOPY ? ?Date of Surgery:  Clearance 02/21/22                              ?   ?Surgeon:  DR. Bosie Clos ?Surgeon's Group or Practice Name:  [facility] ?Phone number:  [phone] ?Fax number:  [phone] ?  ?Type of Clearance Requested:   ?- Medical  ?- Pharmacy:  Hold Apixaban (Eliquis)   ?  ?Type of Anesthesia:   PROPOFOL ?  ?Additional requests/questions:   ? ?Signed, ?Danielle Rankin   ?01/17/2022, 1:10 PM  ? ?

## 2022-01-21 NOTE — Telephone Encounter (Signed)
Non VV call placed to patient to ensure no change in symptoms from recent OV, got VM, LMTCB. Anticipate clearing once we confirm patient has not had any new issues. ?

## 2022-01-21 NOTE — Telephone Encounter (Signed)
Patient with diagnosis of atrial fibrillation on Eliquis for anticoagulation.   ? ?Procedure: colonoscopy ?Date of procedure: 02/21/22 ? ? ?CHA2DS2-VASc Score = 3  ? This indicates a 3.2% annual risk of stroke. ?The patient's score is based upon: ?CHF History: 0 ?HTN History: 1 ?Diabetes History: 0 ?Stroke History: 0 ?Vascular Disease History: 0 ?Age Score: 1 ?Gender Score: 1 ?  ?CrCl 83 ?Platelet count 315 ? ?Per office protocol, patient can hold Eliquis for 2 days prior to procedure.   ?Patient will not need bridging with Lovenox (enoxaparin) around procedure. ? ? ? ?

## 2022-01-22 NOTE — Telephone Encounter (Signed)
? ?  Name: Alexandra Rogers  ?DOB: 10/10/1946  ?MRN: [id number]  ? ?Primary Cardiologist: Lesleigh Noe, MD ? ?Chart reviewed as part of pre-operative protocol coverage. Patient was contacted 01/22/2022 in reference to pre-operative risk assessment for pending surgery as outlined below.  Alexandra Rogers was last seen on 12/09/2021 by Dr. Katrinka Blazing.  Since that day, Alexandra Rogers has done well without exertional chest pain or worsening dyspnea.  Patient previously had CT coronary calcium scoring in January 2022 that revealed a 0 calcium. ? ?Therefore, based on ACC/AHA guidelines, the patient would be at acceptable risk for the planned procedure without further cardiovascular testing.  ? ?Patient may hold Eliquis for 2 days prior to the procedure and restart as soon as possible afterward at the GI doctor's discretion. ? ?The patient was advised that if she develops new symptoms prior to surgery to contact our office to arrange for a follow-up visit, and she verbalized understanding. ? ?I will route this recommendation to the requesting party via Epic fax function and remove from pre-op pool. Please call with questions. ? ?Azalee Course, [place] ?01/22/2022, 9:23 AM ? ?

## 2022-01-27 ENCOUNTER — Ambulatory Visit
Admission: RE | Admit: 2022-01-27 | Discharge: 2022-01-27 | Disposition: A | Discharge status: Home / Self Care | Payer: Medicare PPO | Source: Ambulatory Visit | Attending: Family Medicine | Admitting: Family Medicine

## 2022-01-27 DIAGNOSIS — Z1231 Encounter for screening mammogram for malignant neoplasm of breast: Secondary | ICD-10-CM | POA: Diagnosis not present

## 2022-01-29 ENCOUNTER — Other Ambulatory Visit: Payer: Self-pay | Admitting: Family Medicine

## 2022-01-29 DIAGNOSIS — R928 Other abnormal and inconclusive findings on diagnostic imaging of breast: Secondary | ICD-10-CM

## 2022-02-10 ENCOUNTER — Ambulatory Visit (INDEPENDENT_AMBULATORY_CARE_PROVIDER_SITE_OTHER): Payer: Medicare PPO

## 2022-02-10 DIAGNOSIS — I442 Atrioventricular block, complete: Secondary | ICD-10-CM | POA: Diagnosis not present

## 2022-02-11 LAB — CUP PACEART REMOTE DEVICE CHECK
Battery Remaining Longevity: 136 mo
Battery Voltage: 3.17 V
Brady Statistic AP VP Percent: 39.53 %
Brady Statistic AP VS Percent: 1.53 %
Brady Statistic AS VP Percent: 14.09 %
Brady Statistic AS VS Percent: 44.85 %
Brady Statistic RA Percent Paced: 41.07 %
Brady Statistic RV Percent Paced: 53.62 %
Date Time Interrogation Session: 20230418094212
Implantable Lead Implant Date: 20230113
Implantable Lead Implant Date: 20230113
Implantable Lead Location: 753859
Implantable Lead Location: 753860
Implantable Lead Model: 3830
Implantable Lead Model: 5076
Implantable Pulse Generator Implant Date: 20230113
Lead Channel Impedance Value: 380 Ohm
Lead Channel Impedance Value: 380 Ohm
Lead Channel Impedance Value: 494 Ohm
Lead Channel Impedance Value: 494 Ohm
Lead Channel Pacing Threshold Amplitude: 0.625 V
Lead Channel Pacing Threshold Amplitude: 0.625 V
Lead Channel Pacing Threshold Pulse Width: 0.4 ms
Lead Channel Pacing Threshold Pulse Width: 0.4 ms
Lead Channel Sensing Intrinsic Amplitude: 4.125 mV
Lead Channel Sensing Intrinsic Amplitude: 4.125 mV
Lead Channel Sensing Intrinsic Amplitude: 6 mV
Lead Channel Sensing Intrinsic Amplitude: 6 mV
Lead Channel Setting Pacing Amplitude: 2.75 V
Lead Channel Setting Pacing Amplitude: 2.75 V
Lead Channel Setting Pacing Pulse Width: 0.4 ms
Lead Channel Setting Sensing Sensitivity: 1.2 mV

## 2022-02-17 ENCOUNTER — Other Ambulatory Visit: Payer: Self-pay | Admitting: Family Medicine

## 2022-02-17 ENCOUNTER — Ambulatory Visit
Admission: RE | Admit: 2022-02-17 | Discharge: 2022-02-17 | Disposition: A | Discharge status: Home / Self Care | Payer: Medicare PPO | Source: Ambulatory Visit | Attending: Family Medicine | Admitting: Family Medicine

## 2022-02-17 DIAGNOSIS — R928 Other abnormal and inconclusive findings on diagnostic imaging of breast: Secondary | ICD-10-CM

## 2022-02-17 DIAGNOSIS — R922 Inconclusive mammogram: Secondary | ICD-10-CM | POA: Diagnosis not present

## 2022-02-17 DIAGNOSIS — N6012 Diffuse cystic mastopathy of left breast: Secondary | ICD-10-CM | POA: Diagnosis not present

## 2022-02-17 DIAGNOSIS — N6489 Other specified disorders of breast: Secondary | ICD-10-CM

## 2022-02-27 ENCOUNTER — Encounter: Payer: Medicare PPO | Admitting: Cardiology

## 2022-02-27 NOTE — Progress Notes (Signed)
Remote pacemaker transmission.   

## 2022-02-28 ENCOUNTER — Ambulatory Visit
Admission: RE | Admit: 2022-02-28 | Discharge: 2022-02-28 | Disposition: A | Discharge status: Home / Self Care | Payer: Medicare PPO | Source: Ambulatory Visit | Attending: Family Medicine | Admitting: Family Medicine

## 2022-02-28 DIAGNOSIS — N6489 Other specified disorders of breast: Secondary | ICD-10-CM

## 2022-02-28 DIAGNOSIS — R928 Other abnormal and inconclusive findings on diagnostic imaging of breast: Secondary | ICD-10-CM | POA: Diagnosis not present

## 2022-02-28 DIAGNOSIS — N62 Hypertrophy of breast: Secondary | ICD-10-CM | POA: Diagnosis not present

## 2022-03-06 ENCOUNTER — Ambulatory Visit (INDEPENDENT_AMBULATORY_CARE_PROVIDER_SITE_OTHER): Payer: Medicare PPO | Admitting: Student

## 2022-03-06 ENCOUNTER — Encounter: Payer: Self-pay | Admitting: Student

## 2022-03-06 VITALS — BP 130/80 | HR 62 | Ht 65.5 in | Wt 281.0 lb

## 2022-03-06 DIAGNOSIS — R001 Bradycardia, unspecified: Secondary | ICD-10-CM

## 2022-03-06 DIAGNOSIS — I441 Atrioventricular block, second degree: Secondary | ICD-10-CM

## 2022-03-06 LAB — CUP PACEART INCLINIC DEVICE CHECK
Battery Remaining Longevity: 160 mo
Battery Voltage: 3.18 V
Brady Statistic AP VP Percent: 39.97 %
Brady Statistic AP VS Percent: 1.58 %
Brady Statistic AS VP Percent: 13.35 %
Brady Statistic AS VS Percent: 45.1 %
Brady Statistic RA Percent Paced: 41.56 %
Brady Statistic RV Percent Paced: 53.32 %
Date Time Interrogation Session: 20230511105151
Implantable Lead Implant Date: 20230113
Implantable Lead Implant Date: 20230113
Implantable Lead Location: 753859
Implantable Lead Location: 753860
Implantable Lead Model: 3830
Implantable Lead Model: 5076
Implantable Pulse Generator Implant Date: 20230113
Lead Channel Impedance Value: 399 Ohm
Lead Channel Impedance Value: 399 Ohm
Lead Channel Impedance Value: 513 Ohm
Lead Channel Impedance Value: 532 Ohm
Lead Channel Pacing Threshold Amplitude: 0.625 V
Lead Channel Pacing Threshold Amplitude: 0.75 V
Lead Channel Pacing Threshold Pulse Width: 0.4 ms
Lead Channel Pacing Threshold Pulse Width: 0.4 ms
Lead Channel Sensing Intrinsic Amplitude: 3.75 mV
Lead Channel Sensing Intrinsic Amplitude: 4.25 mV
Lead Channel Sensing Intrinsic Amplitude: 4.875 mV
Lead Channel Sensing Intrinsic Amplitude: 5.25 mV
Lead Channel Setting Pacing Amplitude: 1.5 V
Lead Channel Setting Pacing Amplitude: 2 V
Lead Channel Setting Pacing Pulse Width: 0.4 ms
Lead Channel Setting Sensing Sensitivity: 1.2 mV

## 2022-03-06 NOTE — Progress Notes (Signed)
? ? ?Electrophysiology Office Note ?Date: 03/06/2022 ? ?ID:  Alexandra Rogers, DOB Feb 01, 1946, MRN [id number] ? ?PCP: Gweneth Dimitri, MD ?Primary Cardiologist: Lesleigh Noe, MD ?Electrophysiologist: Will Jorja Loa, MD  ? ?CC: Pacemaker follow-up ? ?Alexandra Rogers is a 76 y.o. female seen today for Will Jorja Loa, MD for routine electrophysiology followup.  Since last being seen in our clinic the patient reports doing very well.  she denies chest pain, palpitations, dyspnea, PND, orthopnea, nausea, vomiting, dizziness, syncope, edema, weight gain, or early satiety. ? ?Device History: ?Medtronic Dual Chamber PPM implanted 10/2021 for symptomatic bradycardia ? ?Past Medical History:  ?Diagnosis Date  ? Chronic anticoagulation 12/17/2016  ? CHADs VASc=3  ? Dysrhythmia   ? LBBB, arrythmmia  ? Hypertension   ? LBBB (left bundle branch block)   ? Normal coronary arteries 2004  ? PAF (paroxysmal atrial fibrillation) (HCC) 12/17/2016  ? Shortness of breath   ? on exertion due to weight  ? ?Past Surgical History:  ?Procedure Laterality Date  ? APPENDECTOMY    ? BREAST EXCISIONAL BIOPSY Right   ? BREAST SURGERY    ? CARDIOVERSION  12/17/2016  ? in ED for 2:1 A flutter with LBBB  ? CHOLECYSTECTOMY    ? HYSTEROSCOPY WITH D & C  09/22/2012  ? Procedure: DILATATION AND CURETTAGE /HYSTEROSCOPY;  Surgeon: Geryl Rankins, MD;  Location: [facility];  Service: Gynecology;  Laterality: N/A;  ? LEFT HEART CATH  2004  ? Normal coronaries  ? PACEMAKER IMPLANT N/A 11/08/2021  ? Procedure: PACEMAKER IMPLANT;  Surgeon: Regan Lemming, MD;  [facility];  Service: Cardiovascular;  Laterality: N/A;  ? SHOULDER SURGERY    ? VAGINAL DELIVERY  1978. 1983  ? WRIST FRACTURE SURGERY    ? ? ?Current Outpatient Medications  ?Medication Sig Dispense Refill  ? Acetaminophen (TYLENOL ARTHRITIS PAIN PO) Take 2 capsules by mouth daily.    ? apixaban (ELIQUIS) 5 MG TABS tablet Take 1 tablet (5 mg total) by mouth 2 (two)  times daily. 180 tablet 3  ? calcium-vitamin D (OSCAL WITH D) 500-200 MG-UNIT per tablet Take 1 tablet by mouth daily.    ? Cholecalciferol (VITAMIN D) 2000 UNITS tablet Take 2,000 Units by mouth daily.    ? metoprolol succinate (TOPROL XL) 100 MG 24 hr tablet Take 1 tablet (100 mg total) by mouth daily. Take with or immediately following a meal. 90 tablet 3  ? Multiple Vitamin (MULTIVITAMIN WITH MINERALS) TABS Take 1 tablet by mouth daily.    ? naftifine (NAFTIN) 1 % cream Apply 1 application topically daily as needed. (ring worm)  1  ? progesterone (PROMETRIUM) 100 MG capsule Take 100 mg by mouth daily.    ? rosuvastatin (CRESTOR) 5 MG tablet Take 5 mg by mouth daily.    ? valsartan-hydrochlorothiazide (DIOVAN-HCT) 320-12.5 MG tablet Take 1 tablet by mouth daily.    ? ?No current facility-administered medications for this visit.  ? ? ?Allergies:   Patient has no known allergies.  ? ?Social History: ?Social History  ? ?Socioeconomic History  ? Marital status: Married  ?  Spouse name: Not on file  ? Number of children: Not on file  ? Years of education: Not on file  ? Highest education level: Not on file  ?Occupational History  ? Not on file  ?Tobacco Use  ? Smoking status: Former  ? Smokeless tobacco: Never  ?Vaping Use  ? Vaping Use: Never used  ?Substance and Sexual  Activity  ? Alcohol use: Yes  ?  Comment: wine daily  ? Drug use: No  ? Sexual activity: Not on file  ?Other Topics Concern  ? Not on file  ?Social History Narrative  ? Not on file  ? ?Social Determinants of Health  ? ?Financial Resource Strain: Not on file  ?Food Insecurity: Not on file  ?Transportation Needs: Not on file  ?Physical Activity: Not on file  ?Stress: Not on file  ?Social Connections: Not on file  ?Intimate Partner Violence: Not on file  ? ? ?Family History: ?Family History  ?Problem Relation Age of Onset  ? Crohn's disease Mother   ? Pancreatic cancer Father   ? ? ? ?Review of Systems: ?All other systems reviewed and are otherwise  negative except as noted above. ? ?Physical Exam: ?Vitals:  ? 03/06/22 0942  ?BP: 130/80  ?Pulse: 62  ?SpO2: 96%  ?Weight: 281 lb (127.5 kg)  ?Height: 5' 5.5" (1.664 m)  ?  ? ?GEN- The patient is well appearing, alert and oriented x 3 today.   ?HEENT: normocephalic, atraumatic; sclera clear, conjunctiva pink; hearing intact; oropharynx clear; neck supple  ?Lungs- Clear to ausculation bilaterally, normal work of breathing.  No wheezes, rales, rhonchi ?Heart- Regular rate and rhythm, no murmurs, rubs or gallops  ?GI- soft, non-tender, non-distended, bowel sounds present  ?Extremities- no clubbing or cyanosis. No edema ?MS- no significant deformity or atrophy ?Skin- warm and dry, no rash or lesion; PPM pocket well healed ?Psych- euthymic mood, full affect ?Neuro- strength and sensation are intact ? ?PPM Interrogation- reviewed in detail today,  See PACEART report ? ?EKG:  EKG is not ordered today. ? ?Recent Labs: ?10/31/2021: ALT 15; BUN 20; Creatinine, Ser 1.04; Hemoglobin 14.3; Platelets 315; Potassium 4.3; Sodium 141; TSH 2.940  ? ?Wt Readings from Last 3 Encounters:  ?03/06/22 281 lb (127.5 kg)  ?12/09/21 278 lb 11.2 oz (126.4 kg)  ?11/08/21 280 lb (127 kg)  ?  ? ?Other studies Reviewed: ?Additional studies/ records that were reviewed today include: Previous EP office notes, Previous remote checks, Most recent labwork.  ? ?Assessment and Plan: ? ?1. Symptomatic bradycardia s/p Medtronic PPM  ?Normal PPM function ?See Arita Miss Art report ?No changes today ?SAV increased to 170 ms which allowed for more often AV conduction. Pt declined LRL of 50 or further adjustment of AV delays when possible changes for intrinsic preferences were discussed. Overall, the estimated improvement in battery life was 0.1 year, so the issue was not pressed.  ? ?Current medicines are reviewed at length with the patient today.   ? ?Disposition:   Follow up with Dr. Elberta Fortis in 12 months  ? ?Signed, ?Graciella Freer, PA-C  ?03/06/2022 ?10:01  AM ? ?[facility] ?[address] ?[address] ?[place] [address] ?[phone] (office) ?[phone] (fax)  ?

## 2022-03-25 DIAGNOSIS — M170 Bilateral primary osteoarthritis of knee: Secondary | ICD-10-CM | POA: Diagnosis not present

## 2022-03-25 DIAGNOSIS — N6489 Other specified disorders of breast: Secondary | ICD-10-CM | POA: Diagnosis not present

## 2022-05-11 NOTE — Progress Notes (Deleted)
Sleep Medicine CONSULT Note    Date:  05/11/2022   ID:  [name], DOB [birth year], MRN [id number]  PCP:  Gweneth Dimitri, MD  Cardiologist:  Verdis Prime, MD   No chief complaint on file.   History of Present Illness:  Alexandra Rogers is a 76 y.o. female who is being seen today for the evaluation of OSA at the request of Gweneth Dimitri, MD and Verdis Prime, MD  This is a 76yo female with a hx of PAF, HTN, LBBB and OSA on CPAP.  She was originally followed by me for OSA but was lost to followup.  She is now referred to me to reestablish Sleep medicine care.    She is doing well with her CPAP device and thinks that She has gotten used to it.  She tolerates the mask and feels the pressure is adequate.  Since going on CPAP she feels rested in the am and has no significant daytime sleepiness.  She denies any significant mouth or nasal dryness or nasal congestion.  She does not think that he snores.     Past Medical History:  Diagnosis Date   Chronic anticoagulation 12/17/2016   CHADs VASc=3   Dysrhythmia    LBBB, arrythmmia   Hypertension    LBBB (left bundle branch block)    Normal coronary arteries 2004   PAF (paroxysmal atrial fibrillation) (HCC) 12/17/2016   Shortness of breath    on exertion due to weight    Past Surgical History:  Procedure Laterality Date   APPENDECTOMY     BREAST EXCISIONAL BIOPSY Right    BREAST SURGERY     CARDIOVERSION  12/17/2016   in ED for 2:1 A flutter with LBBB   CHOLECYSTECTOMY     HYSTEROSCOPY WITH D & C  09/22/2012   Procedure: DILATATION AND CURETTAGE /HYSTEROSCOPY;  Surgeon: Geryl Rankins, MD;  Location: [facility];  Service: Gynecology;  Laterality: N/A;   LEFT HEART CATH  2004   Normal coronaries   PACEMAKER IMPLANT N/A 11/08/2021   Procedure: PACEMAKER IMPLANT;  Surgeon: Regan Lemming, MD;  [facility];  Service: Cardiovascular;  Laterality: N/A;   SHOULDER SURGERY     VAGINAL DELIVERY  1978. 1983    WRIST FRACTURE SURGERY      Current Medications: No outpatient medications have been marked as taking for the 05/12/22 encounter (Appointment) with Quintella Reichert, MD.    Allergies:   Patient has no known allergies.   Social History   Socioeconomic History   Marital status: Married    Spouse name: Not on file   Number of children: Not on file   Years of education: Not on file   Highest education level: Not on file  Occupational History   Not on file  Tobacco Use   Smoking status: Former   Smokeless tobacco: Never  Vaping Use   Vaping Use: Never used  Substance and Sexual Activity   Alcohol use: Yes    Comment: wine daily   Drug use: No   Sexual activity: Not on file  Other Topics Concern   Not on file  Social History Narrative   Not on file   Social Determinants of Health   Financial Resource Strain: Not on file  Food Insecurity: Not on file  Transportation Needs: Not on file  Physical Activity: Not on file  Stress: Not on file  Social Connections: Not on file     Family History:  The  patient's Shefamily history includes Crohn's disease in her mother; Pancreatic cancer in her father.   ROS:   Please see the history of present illness.    ROS All other systems reviewed and are negative.      No data to display             PHYSICAL EXAM:   VS:  There were no vitals taken for this visit.   GEN: Well nourished, well developed, in no acute distress  HEENT: normal  Neck: no JVD, carotid bruits, or masses Cardiac: RRR; no murmurs, rubs, or gallops,no edema.  Intact distal pulses bilaterally.  Respiratory:  clear to auscultation bilaterally, normal work of breathing GI: soft, nontender, nondistended, + BS MS: no deformity or atrophy  Skin: warm and dry, no rash Neuro:  Alert and Oriented x 3, Strength and sensation are intact Psych: euthymic mood, full affect  Wt Readings from Last 3 Encounters:  03/06/22 281 lb (127.5 kg)  12/09/21 278 lb 11.2 oz  (126.4 kg)  11/08/21 280 lb (127 kg)      Studies/Labs Reviewed:   PAP compliance download  Recent Labs: 10/31/2021: ALT 15; BUN 20; Creatinine, Ser 1.04; Hemoglobin 14.3; Platelets 315; Potassium 4.3; Sodium 141; TSH 2.940   Lipid Panel No results found for: "CHOL", "TRIG", "HDL", "CHOLHDL", "VLDL", "LDLCALC", "LDLDIRECT"   CHA2DS2-VASc Score = 3   This indicates a 3.2% annual risk of stroke. The patient's score is based upon: CHF History: 0 HTN History: 1 Diabetes History: 0 Stroke History: 0 Vascular Disease History: 0 Age Score: 1 Gender Score: 1   {This patient has a significant risk of stroke if diagnosed with atrial fibrillation.  Please consider VKA or DOAC agent for anticoagulation if the bleeding risk is acceptable.   You can also use the SmartPhrase .[email or web address] for documentation.   :[id number]}     Additional studies/ records that were reviewed today include:  PAP compliance download    ASSESSMENT:    1. OSA (obstructive sleep apnea)   2. Essential hypertension      PLAN:  In order of problems listed above:  OSA - The patient is tolerating PAP therapy well without any problems. The PAP download performed by his DME was personally reviewed and interpreted by me today and showed an AHI of ***/hr on *** cm H2O with ***% compliance in using more than 4 hours nightly.  The patient has been using and benefiting from PAP use and will continue to benefit from therapy.    HTN -BP controlled on exam today -continue prescription drug management with TOprol XL 100mg  daily and Diovan-HCT 320-12.5mg  daily with PRN refills  Time Spent: *** minutes total time of encounter, including *** minutes spent in face-to-face patient care on the date of this encounter. This time includes coordination of care and counseling regarding above mentioned problem list. Remainder of non-face-to-face time involved reviewing chart documents/testing relevant to the patient encounter and  documentation in the medical record. I have independently reviewed documentation from referring provider  Medication Adjustments/Labs and Tests Ordered: Current medicines are reviewed at length with the patient today.  Concerns regarding medicines are outlined above.  Medication changes, Labs and Tests ordered today are listed in the Patient Instructions below.  There are no Patient Instructions on file for this visit.   Signed, , MD  05/11/2022 10:20 AM    [facility] [address] [place], [place], [place]  [place] Phone: [phone]; Fax: [phone]

## 2022-05-12 ENCOUNTER — Ambulatory Visit: Payer: Medicare PPO | Admitting: Cardiology

## 2022-05-12 ENCOUNTER — Ambulatory Visit (INDEPENDENT_AMBULATORY_CARE_PROVIDER_SITE_OTHER): Payer: Medicare PPO

## 2022-05-12 DIAGNOSIS — I441 Atrioventricular block, second degree: Secondary | ICD-10-CM

## 2022-05-12 DIAGNOSIS — I10 Essential (primary) hypertension: Secondary | ICD-10-CM

## 2022-05-12 DIAGNOSIS — G4733 Obstructive sleep apnea (adult) (pediatric): Secondary | ICD-10-CM

## 2022-05-13 LAB — CUP PACEART REMOTE DEVICE CHECK
Battery Remaining Longevity: 156 mo
Battery Voltage: 3.15 V
Brady Statistic AP VP Percent: 45.68 %
Brady Statistic AP VS Percent: 1.55 %
Brady Statistic AS VP Percent: 7.15 %
Brady Statistic AS VS Percent: 45.63 %
Brady Statistic RA Percent Paced: 47.25 %
Brady Statistic RV Percent Paced: 52.83 %
Date Time Interrogation Session: 20230716195941
Implantable Lead Implant Date: 20230113
Implantable Lead Implant Date: 20230113
Implantable Lead Location: 753859
Implantable Lead Location: 753860
Implantable Lead Model: 3830
Implantable Lead Model: 5076
Implantable Pulse Generator Implant Date: 20230113
Lead Channel Impedance Value: 361 Ohm
Lead Channel Impedance Value: 380 Ohm
Lead Channel Impedance Value: 475 Ohm
Lead Channel Impedance Value: 475 Ohm
Lead Channel Pacing Threshold Amplitude: 0.625 V
Lead Channel Pacing Threshold Amplitude: 0.875 V
Lead Channel Pacing Threshold Pulse Width: 0.4 ms
Lead Channel Pacing Threshold Pulse Width: 0.4 ms
Lead Channel Sensing Intrinsic Amplitude: 4.5 mV
Lead Channel Sensing Intrinsic Amplitude: 4.5 mV
Lead Channel Sensing Intrinsic Amplitude: 4.625 mV
Lead Channel Sensing Intrinsic Amplitude: 4.625 mV
Lead Channel Setting Pacing Amplitude: 1.5 V
Lead Channel Setting Pacing Amplitude: 2 V
Lead Channel Setting Pacing Pulse Width: 0.4 ms
Lead Channel Setting Sensing Sensitivity: 1.2 mV

## 2022-05-23 ENCOUNTER — Telehealth: Payer: Self-pay | Admitting: *Deleted

## 2022-05-23 NOTE — Telephone Encounter (Signed)
ERROR, THIS IS CLEARANCE FOR PACEMAKER, WILL DELIVER TO DEVICE.

## 2022-06-12 ENCOUNTER — Telehealth: Payer: Self-pay | Admitting: *Deleted

## 2022-06-12 ENCOUNTER — Encounter: Payer: Self-pay | Admitting: Cardiology

## 2022-06-12 NOTE — Progress Notes (Signed)
Remote pacemaker transmission.   

## 2022-06-12 NOTE — Telephone Encounter (Signed)
   Pre-operative Risk Assessment    Patient Name: Alexandra Rogers  DOB: 1946/06/23 MRN: [id number]      Request for Surgical Clearance    Procedure:   COLONSCOPY  Date of Surgery:  Clearance TBD                                 Surgeon:  DR. Bosie Clos Surgeon's Group or Practice Name:  [facility] Phone number:  [phone] Fax number:  [phone]   Type of Clearance Requested:   - Pharmacy:  Hold Apixaban (Eliquis) NOT INDICATED   Type of Anesthesia:   PROPOFOL   Additional requests/questions:   THEY ARE ALSO ASKING CLEARANCE FOR PT'S PACEMAKER.. WILL HAND DELIVER TO DEVICE   Signed, Elliot Cousin   06/12/2022, 3:13 PM

## 2022-06-12 NOTE — Progress Notes (Signed)
Cardiology Office Note:    Date:  06/13/2022   ID:  Alexandra Rogers, DOB 09/14/1946, MRN [id number]  PCP:  Gweneth Dimitri, MD  Cardiologist:  Lesleigh Noe, MD   Referring MD: Gweneth Dimitri, MD   Chief Complaint  Patient presents with   Atrial Fibrillation   Hypertension   Hyperlipidemia    History of Present Illness:    Alexandra Rogers is a 76 y.o. female with a hx of  syncope, LBBB,  normal coronaries, PAF (with prior cardioversion due to persistent atrial flutter) CHADS VASC > 2, obstructive sleep apnea but not on CPAP (never had a titration study), chronic anticoagulation therapy with Eliquis 5 mg twice daily, PPM 11/08/2021 for high grade AV block.  Seeking clearance for colonoscopy and relative to pacemaker.  She is doing well.  There have been no cardiac issues.  She still has some stinging and burning in the left subclavicular pacemaker insertion site.  She has not had syncope, prolonged palpitations, chest pain, orthopnea, PND, or edema.  Just attended an [employer] baseball game, a lot of walking, tolerated it well.  No bleeding on Eliquis.  No neurological complaints.  No instances of atrial fibrillation on her most recent device check.  Past Medical History:  Diagnosis Date   Chronic anticoagulation 12/17/2016   CHADs VASc=3   Dysrhythmia    LBBB, arrythmmia   Hypertension    LBBB (left bundle branch block)    Normal coronary arteries 2004   PAF (paroxysmal atrial fibrillation) (HCC) 12/17/2016   Shortness of breath    on exertion due to weight    Past Surgical History:  Procedure Laterality Date   APPENDECTOMY     BREAST EXCISIONAL BIOPSY Right    BREAST SURGERY     CARDIOVERSION  12/17/2016   in ED for 2:1 A flutter with LBBB   CHOLECYSTECTOMY     HYSTEROSCOPY WITH D & C  09/22/2012   Procedure: DILATATION AND CURETTAGE /HYSTEROSCOPY;  Surgeon: Geryl Rankins, MD;  Location: [facility];  Service: Gynecology;  Laterality: N/A;   LEFT HEART  CATH  2004   Normal coronaries   PACEMAKER IMPLANT N/A 11/08/2021   Procedure: PACEMAKER IMPLANT;  Surgeon: Regan Lemming, MD;  [facility]: [facility];  Service: Cardiovascular;  Laterality: N/A;   SHOULDER SURGERY     VAGINAL DELIVERY  1978. 1983   WRIST FRACTURE SURGERY      Current Medications: Current Meds  Medication Sig   Acetaminophen (TYLENOL ARTHRITIS PAIN PO) Take 2 capsules by mouth daily.   apixaban (ELIQUIS) 5 MG TABS tablet Take 1 tablet (5 mg total) by mouth 2 (two) times daily.   calcium-vitamin D (OSCAL WITH D) 500-200 MG-UNIT per tablet Take 1 tablet by mouth daily.   Cholecalciferol (VITAMIN D) 2000 UNITS tablet Take 2,000 Units by mouth daily.   metoprolol succinate (TOPROL XL) 100 MG 24 hr tablet Take 1 tablet (100 mg total) by mouth daily. Take with or immediately following a meal.   Multiple Vitamin (MULTIVITAMIN WITH MINERALS) TABS Take 1 tablet by mouth daily.   naftifine (NAFTIN) 1 % cream Apply 1 application topically daily as needed. (ring worm)   pantoprazole (PROTONIX) 40 MG tablet Take 1 tablet by mouth every evening.   progesterone (PROMETRIUM) 100 MG capsule Take 100 mg by mouth daily.   rosuvastatin (CRESTOR) 5 MG tablet Take 5 mg by mouth daily.   valsartan-hydrochlorothiazide (DIOVAN-HCT) 320-12.5 MG tablet Take 1 tablet by mouth  daily.     Allergies:   Patient has no known allergies.   Social History   Socioeconomic History   Marital status: Married    Spouse name: Not on file   Number of children: Not on file   Years of education: Not on file   Highest education level: Not on file  Occupational History   Not on file  Tobacco Use   Smoking status: Former   Smokeless tobacco: Never  Vaping Use   Vaping Use: Never used  Substance and Sexual Activity   Alcohol use: Yes    Comment: wine daily   Drug use: No   Sexual activity: Not on file  Other Topics Concern   Not on file  Social History Narrative   Not on file   Social  Determinants of Health   Financial Resource Strain: Not on file  Food Insecurity: Not on file  Transportation Needs: Not on file  Physical Activity: Not on file  Stress: Not on file  Social Connections: Not on file     Family History: The patient's family history includes Crohn's disease in her mother; Pancreatic cancer in her father.  ROS:   Please see the history of present illness.    Overweight.  Not watching salt in her diet.  All other systems reviewed and are negative.  EKGs/Labs/Other Studies Reviewed:    The following studies were reviewed today: No recent or new studies  EKG:  EKG not performed  Recent Labs: 10/31/2021: ALT 15; BUN 20; Creatinine, Ser 1.04; Hemoglobin 14.3; Platelets 315; Potassium 4.3; Sodium 141; TSH 2.940  Recent Lipid Panel No results found for: "CHOL", "TRIG", "HDL", "CHOLHDL", "VLDL", "LDLCALC", "LDLDIRECT"  Physical Exam:    VS:  BP (!) 142/92   Pulse 60   Ht 5' 5.5" (1.664 m)   Wt 282 lb (127.9 kg)   SpO2 96%   BMI 46.21 kg/m     Wt Readings from Last 3 Encounters:  06/13/22 282 lb (127.9 kg)  03/06/22 281 lb (127.5 kg)  12/09/21 278 lb 11.2 oz (126.4 kg)     GEN: Morbid obesity. No acute distress HEENT: Normal NECK: No JVD. LYMPHATICS: No lymphadenopathy CARDIAC: No murmur. RRR no gallop, or edema. VASCULAR:  Normal Pulses. No bruits. RESPIRATORY:  Clear to auscultation without rales, wheezing or rhonchi  ABDOMEN: Soft, non-tender, non-distended, No pulsatile mass, MUSCULOSKELETAL: No deformity  SKIN: Warm and dry NEUROLOGIC:  Alert and oriented x 3 PSYCHIATRIC:  Normal affect   ASSESSMENT:    1. Tachycardia-bradycardia syndrome (HCC)   2. PAF (paroxysmal atrial fibrillation) (HCC)   3. Pacemaker   4. Heart block AV complete (HCC)   5. Chronic anticoagulation   6. Morbid obesity (HCC)   7. Essential hypertension   8. LBBB (left bundle branch block)    PLAN:    In order of problems listed above:  Stable without  any obvious symptoms. So far controlled with pacemaker therapy.  No antiarrhythmic is being used. Follow in device clinic Treated with pacemaker Continue Eliquis with discussion about the importance of blood pressure control to avoid intracranial bleeding Decrease calories and exercise discussion Target 130/80 mmHg.  Overall education and awareness concerning secondary risk prevention was discussed in detail: LDL less than 70, hemoglobin A1c less than 7, blood pressure target less than 130/80 mmHg, >150 minutes of moderate aerobic activity per week, avoidance of smoking, weight control (via diet and exercise), and continued surveillance/management of/for obstructive sleep apnea.    Medication Adjustments/Labs  and Tests Ordered: Current medicines are reviewed at length with the patient today.  Concerns regarding medicines are outlined above.  No orders of the defined types were placed in this encounter.  No orders of the defined types were placed in this encounter.   Patient Instructions  Medication Instructions:  Your physician recommends that you continue on your current medications as directed. Please refer to the Current Medication list given to you today.  *If you need a refill on your cardiac medications before your next appointment, please call your pharmacy*  Lab Work: NONE  Testing/Procedures: NONE  Follow-Up: At [facility], you and your health needs are our priority.  As part of our continuing mission to provide you with exceptional heart care, we have created designated Provider Care Teams.  These Care Teams include your primary Cardiologist (physician) and Advanced Practice Providers (APPs -  Physician Assistants and Nurse Practitioners) who all work together to provide you with the care you need, when you need it.  Your next appointment:   1 year(s)  The format for your next appointment:   In Person  Provider:   Lesleigh Noe, MD {  Important Information  About Sugar         Signed, Lesleigh Noe, MD  06/13/2022 10:46 AM     [facility]

## 2022-06-12 NOTE — Progress Notes (Signed)
PERIOPERATIVE PRESCRIPTION FOR IMPLANTED CARDIAC DEVICE PROGRAMMING  Patient Information: Name:  Alexandra Rogers  DOB:  01/03/1946  MRN:  [id number]    Request for Surgical Clearance     Procedure:   COLONSCOPY   Date of Surgery:  Clearance TBD                                 Surgeon:  DR. Bosie Clos Surgeon's Group or Practice Name:  [facility] Phone number:  [phone] Fax number:  [phone] Device Information:  Clinic EP Physician:  Loman Brooklyn, MD   Device Type:  Pacemaker Manufacturer and Phone #:  Medtronic: [id number] Pacemaker Dependent?:  No. Date of Last Device Check:  05/11/2022 Normal Device Function?:  Yes.    Electrophysiologist's Recommendations:  Have magnet available. Provide continuous ECG monitoring when magnet is used or reprogramming is to be performed.  Procedure should not interfere with device function.  No device programming or magnet placement needed.  Per Device Clinic Standing Orders, Dorathy Daft, RN  3:57 PM 06/12/2022

## 2022-06-13 ENCOUNTER — Encounter: Payer: Self-pay | Admitting: Interventional Cardiology

## 2022-06-13 ENCOUNTER — Ambulatory Visit: Payer: Medicare PPO | Admitting: Interventional Cardiology

## 2022-06-13 VITALS — BP 130/88 | HR 60 | Ht 65.5 in | Wt 282.0 lb

## 2022-06-13 DIAGNOSIS — I442 Atrioventricular block, complete: Secondary | ICD-10-CM

## 2022-06-13 DIAGNOSIS — Z7901 Long term (current) use of anticoagulants: Secondary | ICD-10-CM | POA: Diagnosis not present

## 2022-06-13 DIAGNOSIS — I447 Left bundle-branch block, unspecified: Secondary | ICD-10-CM | POA: Diagnosis not present

## 2022-06-13 DIAGNOSIS — I495 Sick sinus syndrome: Secondary | ICD-10-CM | POA: Diagnosis not present

## 2022-06-13 DIAGNOSIS — I480 Paroxysmal atrial fibrillation: Secondary | ICD-10-CM

## 2022-06-13 DIAGNOSIS — I10 Essential (primary) hypertension: Secondary | ICD-10-CM | POA: Diagnosis not present

## 2022-06-13 DIAGNOSIS — Z950 Presence of cardiac pacemaker: Secondary | ICD-10-CM

## 2022-06-13 NOTE — Patient Instructions (Signed)

## 2022-06-13 NOTE — Telephone Encounter (Signed)
   Patient Name: Alexandra Rogers  DOB: [birth year] MRN: [id number]  Primary Cardiologist: Lesleigh Noe, MD  Clinical pharmacists have reviewed the patient's past medical history, labs, and current medications as part of preoperative protocol coverage. The following recommendations have been made:  Patient with diagnosis of atrial fibrillation on Eliquis for anticoagulation.     Procedure: colonoscopy Date of procedure: TBD     CHA2DS2-VASc Score = 3   This indicates a 3.2% annual risk of stroke. The patient's score is based upon: CHF History: 0 HTN History: 1 Diabetes History: 0 Stroke History: 0 Vascular Disease History: 0 Age Score: 1 Gender Score: 1     CrCl 96 Platelet count 315   Per office protocol, patient can hold Eliquis for 2 days prior to procedure.   Patient will not need bridging with Lovenox (enoxaparin) around procedure.  I will route this recommendation to the requesting party via Epic fax function and remove from pre-op pool.  Please call with questions.  Joylene Grapes, NP 06/13/2022, 2:56 PM

## 2022-06-13 NOTE — Telephone Encounter (Signed)
Patient with diagnosis of atrial fibrillation on Eliquis for anticoagulation.    Procedure: colonoscopy Date of procedure: TBD   CHA2DS2-VASc Score = 3   This indicates a 3.2% annual risk of stroke. The patient's score is based upon: CHF History: 0 HTN History: 1 Diabetes History: 0 Stroke History: 0 Vascular Disease History: 0 Age Score: 1 Gender Score: 1    CrCl 96 Platelet count 315  Per office protocol, patient can hold Eliquis for 2 days prior to procedure.   Patient will not need bridging with Lovenox (enoxaparin) around procedure.  **This guidance is not considered finalized until pre-operative APP has relayed final recommendations.**

## 2022-06-16 DIAGNOSIS — I10 Essential (primary) hypertension: Secondary | ICD-10-CM | POA: Diagnosis not present

## 2022-06-16 DIAGNOSIS — Z79899 Other long term (current) drug therapy: Secondary | ICD-10-CM | POA: Diagnosis not present

## 2022-06-16 DIAGNOSIS — M85851 Other specified disorders of bone density and structure, right thigh: Secondary | ICD-10-CM | POA: Diagnosis not present

## 2022-06-16 DIAGNOSIS — R7309 Other abnormal glucose: Secondary | ICD-10-CM | POA: Diagnosis not present

## 2022-06-20 DIAGNOSIS — I10 Essential (primary) hypertension: Secondary | ICD-10-CM | POA: Diagnosis not present

## 2022-06-20 DIAGNOSIS — I480 Paroxysmal atrial fibrillation: Secondary | ICD-10-CM | POA: Diagnosis not present

## 2022-06-20 DIAGNOSIS — M722 Plantar fascial fibromatosis: Secondary | ICD-10-CM | POA: Diagnosis not present

## 2022-06-20 DIAGNOSIS — R7303 Prediabetes: Secondary | ICD-10-CM | POA: Diagnosis not present

## 2022-06-20 DIAGNOSIS — E7800 Pure hypercholesterolemia, unspecified: Secondary | ICD-10-CM | POA: Diagnosis not present

## 2022-06-20 DIAGNOSIS — Z23 Encounter for immunization: Secondary | ICD-10-CM | POA: Diagnosis not present

## 2022-06-20 DIAGNOSIS — Z6841 Body Mass Index (BMI) 40.0 and over, adult: Secondary | ICD-10-CM | POA: Diagnosis not present

## 2022-06-20 DIAGNOSIS — Z950 Presence of cardiac pacemaker: Secondary | ICD-10-CM | POA: Diagnosis not present

## 2022-08-11 ENCOUNTER — Ambulatory Visit (INDEPENDENT_AMBULATORY_CARE_PROVIDER_SITE_OTHER): Payer: Medicare PPO

## 2022-08-11 DIAGNOSIS — I442 Atrioventricular block, complete: Secondary | ICD-10-CM | POA: Diagnosis not present

## 2022-08-12 LAB — CUP PACEART REMOTE DEVICE CHECK
Battery Remaining Longevity: 151 mo
Battery Voltage: 3.1 V
Brady Statistic AP VP Percent: 41.11 %
Brady Statistic AP VS Percent: 0.84 %
Brady Statistic AS VP Percent: 26.87 %
Brady Statistic AS VS Percent: 31.19 %
Brady Statistic RA Percent Paced: 42.04 %
Brady Statistic RV Percent Paced: 67.97 %
Date Time Interrogation Session: 20231016104329
Implantable Lead Implant Date: 20230113
Implantable Lead Implant Date: 20230113
Implantable Lead Location: 753859
Implantable Lead Location: 753860
Implantable Lead Model: 3830
Implantable Lead Model: 5076
Implantable Pulse Generator Implant Date: 20230113
Lead Channel Impedance Value: 361 Ohm
Lead Channel Impedance Value: 380 Ohm
Lead Channel Impedance Value: 456 Ohm
Lead Channel Impedance Value: 475 Ohm
Lead Channel Pacing Threshold Amplitude: 0.625 V
Lead Channel Pacing Threshold Amplitude: 0.75 V
Lead Channel Pacing Threshold Pulse Width: 0.4 ms
Lead Channel Pacing Threshold Pulse Width: 0.4 ms
Lead Channel Sensing Intrinsic Amplitude: 3.25 mV
Lead Channel Sensing Intrinsic Amplitude: 3.25 mV
Lead Channel Sensing Intrinsic Amplitude: 4.125 mV
Lead Channel Sensing Intrinsic Amplitude: 4.125 mV
Lead Channel Setting Pacing Amplitude: 1.5 V
Lead Channel Setting Pacing Amplitude: 2 V
Lead Channel Setting Pacing Pulse Width: 0.4 ms
Lead Channel Setting Sensing Sensitivity: 1.2 mV

## 2022-09-03 NOTE — Progress Notes (Signed)
Remote pacemaker transmission.   

## 2022-09-10 ENCOUNTER — Other Ambulatory Visit: Payer: Self-pay | Admitting: Surgery

## 2022-09-10 DIAGNOSIS — N6489 Other specified disorders of breast: Secondary | ICD-10-CM

## 2022-10-14 ENCOUNTER — Other Ambulatory Visit: Payer: Self-pay | Admitting: Surgery

## 2022-10-14 ENCOUNTER — Ambulatory Visit
Admission: RE | Admit: 2022-10-14 | Discharge: 2022-10-14 | Disposition: A | Discharge status: Home / Self Care | Payer: Medicare PPO | Source: Ambulatory Visit | Attending: Surgery | Admitting: Surgery

## 2022-10-14 DIAGNOSIS — N6489 Other specified disorders of breast: Secondary | ICD-10-CM

## 2022-10-14 DIAGNOSIS — R922 Inconclusive mammogram: Secondary | ICD-10-CM | POA: Diagnosis not present

## 2022-11-07 ENCOUNTER — Other Ambulatory Visit: Payer: Self-pay | Admitting: Interventional Cardiology

## 2022-11-07 DIAGNOSIS — I480 Paroxysmal atrial fibrillation: Secondary | ICD-10-CM

## 2022-11-07 NOTE — Telephone Encounter (Signed)
Prescription refill request for Eliquis received. Indication:afib Last office visit:8/23 Scr:0.8 Age: 77 Weight:127.9  kg  Prescription refilled

## 2022-11-10 ENCOUNTER — Ambulatory Visit (INDEPENDENT_AMBULATORY_CARE_PROVIDER_SITE_OTHER): Payer: Medicare PPO

## 2022-11-10 DIAGNOSIS — I442 Atrioventricular block, complete: Secondary | ICD-10-CM | POA: Diagnosis not present

## 2022-11-12 DIAGNOSIS — M170 Bilateral primary osteoarthritis of knee: Secondary | ICD-10-CM | POA: Diagnosis not present

## 2022-11-12 LAB — CUP PACEART REMOTE DEVICE CHECK
Battery Remaining Longevity: 148 mo
Battery Voltage: 3.06 V
Brady Statistic AP VP Percent: 21.75 %
Brady Statistic AP VS Percent: 0.88 %
Brady Statistic AS VP Percent: 42.3 %
Brady Statistic AS VS Percent: 35.06 %
Brady Statistic RA Percent Paced: 22.81 %
Brady Statistic RV Percent Paced: 64.06 %
Date Time Interrogation Session: 20240114211737
Implantable Lead Connection Status: 753985
Implantable Lead Connection Status: 753985
Implantable Lead Implant Date: 20230113
Implantable Lead Implant Date: 20230113
Implantable Lead Location: 753859
Implantable Lead Location: 753860
Implantable Lead Model: 3830
Implantable Lead Model: 5076
Implantable Pulse Generator Implant Date: 20230113
Lead Channel Impedance Value: 342 Ohm
Lead Channel Impedance Value: 361 Ohm
Lead Channel Impedance Value: 456 Ohm
Lead Channel Impedance Value: 456 Ohm
Lead Channel Pacing Threshold Amplitude: 0.5 V
Lead Channel Pacing Threshold Amplitude: 0.75 V
Lead Channel Pacing Threshold Pulse Width: 0.4 ms
Lead Channel Pacing Threshold Pulse Width: 0.4 ms
Lead Channel Sensing Intrinsic Amplitude: 3.125 mV
Lead Channel Sensing Intrinsic Amplitude: 3.125 mV
Lead Channel Sensing Intrinsic Amplitude: 6.25 mV
Lead Channel Sensing Intrinsic Amplitude: 6.25 mV
Lead Channel Setting Pacing Amplitude: 1.5 V
Lead Channel Setting Pacing Amplitude: 2 V
Lead Channel Setting Pacing Pulse Width: 0.4 ms
Lead Channel Setting Sensing Sensitivity: 1.2 mV
Zone Setting Status: 755011
Zone Setting Status: 755011

## 2022-11-18 DIAGNOSIS — Z1389 Encounter for screening for other disorder: Secondary | ICD-10-CM | POA: Diagnosis not present

## 2022-11-18 DIAGNOSIS — Z0000 Encounter for general adult medical examination without abnormal findings: Secondary | ICD-10-CM | POA: Diagnosis not present

## 2022-11-21 DIAGNOSIS — M79671 Pain in right foot: Secondary | ICD-10-CM | POA: Diagnosis not present

## 2022-11-21 DIAGNOSIS — M79672 Pain in left foot: Secondary | ICD-10-CM | POA: Diagnosis not present

## 2022-11-21 DIAGNOSIS — M19072 Primary osteoarthritis, left ankle and foot: Secondary | ICD-10-CM | POA: Diagnosis not present

## 2022-11-21 DIAGNOSIS — M7742 Metatarsalgia, left foot: Secondary | ICD-10-CM | POA: Diagnosis not present

## 2022-12-25 DIAGNOSIS — E7800 Pure hypercholesterolemia, unspecified: Secondary | ICD-10-CM | POA: Diagnosis not present

## 2022-12-25 DIAGNOSIS — R7303 Prediabetes: Secondary | ICD-10-CM | POA: Diagnosis not present

## 2022-12-25 DIAGNOSIS — Z79899 Other long term (current) drug therapy: Secondary | ICD-10-CM | POA: Diagnosis not present

## 2022-12-29 NOTE — Progress Notes (Signed)
Remote pacemaker transmission.   

## 2022-12-31 DIAGNOSIS — D6869 Other thrombophilia: Secondary | ICD-10-CM | POA: Diagnosis not present

## 2022-12-31 DIAGNOSIS — Z6841 Body Mass Index (BMI) 40.0 and over, adult: Secondary | ICD-10-CM | POA: Diagnosis not present

## 2022-12-31 DIAGNOSIS — M170 Bilateral primary osteoarthritis of knee: Secondary | ICD-10-CM | POA: Diagnosis not present

## 2022-12-31 DIAGNOSIS — I480 Paroxysmal atrial fibrillation: Secondary | ICD-10-CM | POA: Diagnosis not present

## 2022-12-31 DIAGNOSIS — Z950 Presence of cardiac pacemaker: Secondary | ICD-10-CM | POA: Diagnosis not present

## 2022-12-31 DIAGNOSIS — R7303 Prediabetes: Secondary | ICD-10-CM | POA: Diagnosis not present

## 2022-12-31 DIAGNOSIS — I495 Sick sinus syndrome: Secondary | ICD-10-CM | POA: Diagnosis not present

## 2022-12-31 DIAGNOSIS — I700 Atherosclerosis of aorta: Secondary | ICD-10-CM | POA: Diagnosis not present

## 2023-01-09 DIAGNOSIS — Z1211 Encounter for screening for malignant neoplasm of colon: Secondary | ICD-10-CM | POA: Diagnosis not present

## 2023-02-06 DIAGNOSIS — H25013 Cortical age-related cataract, bilateral: Secondary | ICD-10-CM | POA: Diagnosis not present

## 2023-02-09 ENCOUNTER — Ambulatory Visit (INDEPENDENT_AMBULATORY_CARE_PROVIDER_SITE_OTHER): Payer: Medicare PPO

## 2023-02-09 DIAGNOSIS — I442 Atrioventricular block, complete: Secondary | ICD-10-CM | POA: Diagnosis not present

## 2023-02-10 LAB — CUP PACEART REMOTE DEVICE CHECK
Battery Remaining Longevity: 143 mo
Battery Voltage: 3.04 V
Brady Statistic AP VP Percent: 48.7 %
Brady Statistic AP VS Percent: 0.07 %
Brady Statistic AS VP Percent: 47.17 %
Brady Statistic AS VS Percent: 4.05 %
Brady Statistic RA Percent Paced: 48.87 %
Brady Statistic RV Percent Paced: 95.88 %
Date Time Interrogation Session: 20240414190120
Implantable Lead Connection Status: 753985
Implantable Lead Connection Status: 753985
Implantable Lead Implant Date: 20230113
Implantable Lead Implant Date: 20230113
Implantable Lead Location: 753859
Implantable Lead Location: 753860
Implantable Lead Model: 3830
Implantable Lead Model: 5076
Implantable Pulse Generator Implant Date: 20230113
Lead Channel Impedance Value: 361 Ohm
Lead Channel Impedance Value: 380 Ohm
Lead Channel Impedance Value: 456 Ohm
Lead Channel Impedance Value: 475 Ohm
Lead Channel Pacing Threshold Amplitude: 0.375 V
Lead Channel Pacing Threshold Amplitude: 0.75 V
Lead Channel Pacing Threshold Pulse Width: 0.4 ms
Lead Channel Pacing Threshold Pulse Width: 0.4 ms
Lead Channel Sensing Intrinsic Amplitude: 3.5 mV
Lead Channel Sensing Intrinsic Amplitude: 3.5 mV
Lead Channel Sensing Intrinsic Amplitude: 6.25 mV
Lead Channel Sensing Intrinsic Amplitude: 6.25 mV
Lead Channel Setting Pacing Amplitude: 1.5 V
Lead Channel Setting Pacing Amplitude: 2 V
Lead Channel Setting Pacing Pulse Width: 0.4 ms
Lead Channel Setting Sensing Sensitivity: 1.2 mV
Zone Setting Status: 755011
Zone Setting Status: 755011

## 2023-02-27 DIAGNOSIS — M25561 Pain in right knee: Secondary | ICD-10-CM | POA: Diagnosis not present

## 2023-02-27 DIAGNOSIS — M25562 Pain in left knee: Secondary | ICD-10-CM | POA: Diagnosis not present

## 2023-03-09 DIAGNOSIS — Z961 Presence of intraocular lens: Secondary | ICD-10-CM | POA: Diagnosis not present

## 2023-03-09 DIAGNOSIS — H269 Unspecified cataract: Secondary | ICD-10-CM | POA: Diagnosis not present

## 2023-03-09 DIAGNOSIS — H2511 Age-related nuclear cataract, right eye: Secondary | ICD-10-CM | POA: Diagnosis not present

## 2023-03-18 NOTE — Progress Notes (Signed)
Remote pacemaker transmission.   

## 2023-04-06 DIAGNOSIS — H25812 Combined forms of age-related cataract, left eye: Secondary | ICD-10-CM | POA: Diagnosis not present

## 2023-04-06 DIAGNOSIS — H2512 Age-related nuclear cataract, left eye: Secondary | ICD-10-CM | POA: Diagnosis not present

## 2023-04-06 DIAGNOSIS — Z961 Presence of intraocular lens: Secondary | ICD-10-CM | POA: Diagnosis not present

## 2023-04-15 ENCOUNTER — Ambulatory Visit
Admission: RE | Admit: 2023-04-15 | Discharge: 2023-04-15 | Disposition: A | Discharge status: Home / Self Care | Payer: Medicare PPO | Source: Ambulatory Visit | Attending: Surgery | Admitting: Surgery

## 2023-04-15 DIAGNOSIS — N6489 Other specified disorders of breast: Secondary | ICD-10-CM | POA: Diagnosis not present

## 2023-05-11 ENCOUNTER — Ambulatory Visit (INDEPENDENT_AMBULATORY_CARE_PROVIDER_SITE_OTHER): Payer: Medicare PPO

## 2023-05-11 DIAGNOSIS — I442 Atrioventricular block, complete: Secondary | ICD-10-CM | POA: Diagnosis not present

## 2023-05-12 LAB — CUP PACEART REMOTE DEVICE CHECK
Battery Remaining Longevity: 139 mo
Battery Voltage: 3.03 V
Brady Statistic AP VP Percent: 49.11 %
Brady Statistic AP VS Percent: 0.04 %
Brady Statistic AS VP Percent: 46.31 %
Brady Statistic AS VS Percent: 4.54 %
Brady Statistic RA Percent Paced: 49.21 %
Brady Statistic RV Percent Paced: 95.42 %
Date Time Interrogation Session: 20240715085446
Implantable Lead Connection Status: 753985
Implantable Lead Connection Status: 753985
Implantable Lead Implant Date: 20230113
Implantable Lead Implant Date: 20230113
Implantable Lead Location: 753859
Implantable Lead Location: 753860
Implantable Lead Model: 3830
Implantable Lead Model: 5076
Implantable Pulse Generator Implant Date: 20230113
Lead Channel Impedance Value: 361 Ohm
Lead Channel Impedance Value: 380 Ohm
Lead Channel Impedance Value: 456 Ohm
Lead Channel Impedance Value: 475 Ohm
Lead Channel Pacing Threshold Amplitude: 0.5 V
Lead Channel Pacing Threshold Amplitude: 0.75 V
Lead Channel Pacing Threshold Pulse Width: 0.4 ms
Lead Channel Pacing Threshold Pulse Width: 0.4 ms
Lead Channel Sensing Intrinsic Amplitude: 4.125 mV
Lead Channel Sensing Intrinsic Amplitude: 4.125 mV
Lead Channel Sensing Intrinsic Amplitude: 6.25 mV
Lead Channel Sensing Intrinsic Amplitude: 6.25 mV
Lead Channel Setting Pacing Amplitude: 1.5 V
Lead Channel Setting Pacing Amplitude: 2 V
Lead Channel Setting Pacing Pulse Width: 0.4 ms
Lead Channel Setting Sensing Sensitivity: 1.2 mV
Zone Setting Status: 755011
Zone Setting Status: 755011

## 2023-05-13 ENCOUNTER — Ambulatory Visit: Payer: Medicare PPO | Admitting: Cardiology

## 2023-05-25 NOTE — Progress Notes (Signed)
Remote pacemaker transmission.   

## 2023-06-04 DIAGNOSIS — M170 Bilateral primary osteoarthritis of knee: Secondary | ICD-10-CM | POA: Diagnosis not present

## 2023-06-11 DIAGNOSIS — M170 Bilateral primary osteoarthritis of knee: Secondary | ICD-10-CM | POA: Diagnosis not present

## 2023-06-18 DIAGNOSIS — M170 Bilateral primary osteoarthritis of knee: Secondary | ICD-10-CM | POA: Diagnosis not present

## 2023-06-21 NOTE — Progress Notes (Unsigned)
Cardiology Office Note Date:  06/22/2023  Patient ID:  Alexandra Rogers, Alexandra Rogers 07/05/1946, MRN [id number] PCP:  Gweneth Dimitri, MD  Cardiologist:  Dr. Katrinka Blazing (ret.) Electrophysiologist: Dr. Elberta Fortis    Chief Complaint:  1 year  History of Present Illness: OREDA MADL is a 77 y.o. female with history of AFib, LBBB, HTN, HLD, CHB w/PPM, OSA (not on CPAP)  She saw A. Tillery may 2023,SAV delay adjusted to promote AV conduction, pt did not want to try and reduce LRL to 50.  No other changed were made.  Saw Dr. Katrinka Blazing 06/13/22 doing well, pending colonoscopy.  TODAY She feels well Looking forward to a trip to [place] then onto [place].  She and her husband travel quite abit She reports good exertional capacity No CP, palpitations or cardiac awareness No near syncope or syncope. No bleeding or signs of beleding  She see her PMD 2x/year, next with labs is in Sept/next month    Device information MDT dual chamber PPM implanted 11/08/21  AF/AAD hx Diagnosed 2018 No AAD to date that I find  Past Medical History:  Diagnosis Date   Chronic anticoagulation 12/17/2016   CHADs VASc=3   Dysrhythmia    LBBB, arrythmmia   Hypertension    LBBB (left bundle branch block)    Normal coronary arteries 2004   PAF (paroxysmal atrial fibrillation) (HCC) 12/17/2016   Shortness of breath    on exertion due to weight    Past Surgical History:  Procedure Laterality Date   APPENDECTOMY     BREAST EXCISIONAL BIOPSY Right    BREAST SURGERY     CARDIOVERSION  12/17/2016   in ED for 2:1 A flutter with LBBB   CHOLECYSTECTOMY     HYSTEROSCOPY WITH D & C  09/22/2012   Procedure: DILATATION AND CURETTAGE /HYSTEROSCOPY;  Surgeon: Geryl Rankins, MD;  Location: [facility];  Service: Gynecology;  Laterality: N/A;   LEFT HEART CATH  2004   Normal coronaries   PACEMAKER IMPLANT N/A 11/08/2021   Procedure: PACEMAKER IMPLANT;  Surgeon: Regan Lemming, MD;  Location: [facility];   Service: Cardiovascular;  Laterality: N/A;   SHOULDER SURGERY     VAGINAL DELIVERY  1978. 1983   WRIST FRACTURE SURGERY      Current Outpatient Medications  Medication Sig Dispense Refill   Acetaminophen (TYLENOL ARTHRITIS PAIN PO) Take 2 capsules by mouth daily.     calcium-vitamin D (OSCAL WITH D) 500-200 MG-UNIT per tablet Take 1 tablet by mouth daily.     Cholecalciferol (VITAMIN D) 2000 UNITS tablet Take 2,000 Units by mouth daily.     ELIQUIS 5 MG TABS tablet TAKE 1 TABLET(5 MG) BY MOUTH TWICE DAILY 180 tablet 3   Multiple Vitamin (MULTIVITAMIN WITH MINERALS) TABS Take 1 tablet by mouth daily.     naftifine (NAFTIN) 1 % cream Apply 1 application  topically as needed. (ring worm)  1   pantoprazole (PROTONIX) 40 MG tablet Take 1 tablet by mouth every evening.     progesterone (PROMETRIUM) 100 MG capsule Take 100 mg by mouth at bedtime.     rosuvastatin (CRESTOR) 5 MG tablet Take 5 mg by mouth daily.     valsartan-hydrochlorothiazide (DIOVAN-HCT) 320-12.5 MG tablet Take 1 tablet by mouth daily.     metoprolol succinate (TOPROL XL) 100 MG 24 hr tablet Take 1 tablet (100 mg total) by mouth daily. Take with or immediately following a meal. 90 tablet 3   progesterone (PROMETRIUM)  100 MG capsule Take 100 mg by mouth daily.     No current facility-administered medications for this visit.    Allergies:   Patient has no known allergies.   Social History:  The patient  reports that she has quit smoking. She has never used smokeless tobacco. She reports current alcohol use. She reports that she does not use drugs.   Family History:  The patient's family history includes Crohn's disease in her mother; Pancreatic cancer in her father.  ROS:  Please see the history of present illness.    All other systems are reviewed and otherwise negative.   PHYSICAL EXAM:  VS:  BP 132/76 (BP Location: Left Arm, Patient Position: Sitting, Cuff Size: Large)   Pulse 60   Ht 5\' 5"  (1.651 m)   Wt 275 lb 12.8  oz (125.1 kg)   SpO2 97%   BMI 45.90 kg/m  BMI: Body mass index is 45.9 kg/m. Well nourished, well developed, in no acute distress HEENT: normocephalic, atraumatic Neck: no JVD, carotid bruits or masses Cardiac:  RRR; no significant murmurs, no rubs, or gallops Lungs:  CTA b/l, no wheezing, rhonchi or rales Abd: soft, nontender MS: no deformity or atrophy Ext: no edema Skin: warm and dry, no rash Neuro:  No gross deficits appreciated Psych: euthymic mood, full affect  PPM site is stable, no tethering or discomfort   EKG:  Done today and reviewed by myself shows  AV paced (QRS is 138), 60bpm  Device interrogation done today and reviewed by myself:  Battery and lead measurements are good She has had NSVTs (in review of remotes are not new for her, since May of 2023 33 events total, longest 3 seconds) VP 78.5% AP 42.8% She is observed to h ave some intrinsic AV conduction while here, though no R waves with LOC in the V Appears at least intermittently dependent   11/23/20 Coronary Ca+ IMPRESSION: No visible coronary artery calcifications. Total coronary calcium score of 0.   Scattered aortic calcifications.  02/23/2020: TTE 1. Left ventricular ejection fraction, by estimation, is 60 to 65%. The  left ventricle has normal function. The left ventricle has no regional  wall motion abnormalities. There is mild left ventricular hypertrophy.  Left ventricular diastolic parameters  are consistent with Grade I diastolic dysfunction (impaired relaxation).   2. Right ventricular systolic function is normal. The right ventricular  size is normal.   3. The mitral valve is grossly normal. Trivial mitral valve  regurgitation.   4. The aortic valve was not well visualized. Aortic valve regurgitation  is not visualized. Peak AV gradient is 10 mmHg.   5. Aortic dilatation noted. There is mild dilatation of the ascending  aorta measuring 39 mm.   6. The inferior vena cava is normal in  size with greater than 50%  respiratory variability, suggesting right atrial pressure of 3 mmHg.   Recent Labs: No results found for requested labs within last 365 days.  No results found for requested labs within last 365 days.   CrCl cannot be calculated (Patient's most recent lab result is older than the maximum 21 days allowed.).   Wt Readings from Last 3 Encounters:  06/22/23 275 lb 12.8 oz (125.1 kg)  06/13/22 282 lb (127.9 kg)  03/06/22 281 lb (127.5 kg)     Other studies reviewed: Additional studies/records reviewed today include: summarized above  ASSESSMENT AND PLAN:  PPM Intact function No programming changes made  Paroxysmal AFib CHA2DS2Vasc is 3, on Eliquis,  appropriately dosed zero % burden  Labs soon with her PMD  HTN Looks OK  Secondary hypercoagulable state    Disposition: F/u with remotes as usual, in clinic in a year again, sooner if needed  Current medicines are reviewed at length with the patient today.  The patient did not have any concerns regarding medicines.  Norma Fredrickson, PA-C 06/22/2023 12:32 PM     [facility] [address] [place] [address] [phone] (office)  [phone] (fax)

## 2023-06-22 ENCOUNTER — Ambulatory Visit: Payer: Medicare PPO | Admitting: Physician Assistant

## 2023-06-22 VITALS — BP 132/76 | HR 60 | Ht 65.0 in | Wt 275.8 lb

## 2023-06-22 DIAGNOSIS — I480 Paroxysmal atrial fibrillation: Secondary | ICD-10-CM

## 2023-06-22 DIAGNOSIS — D6869 Other thrombophilia: Secondary | ICD-10-CM | POA: Diagnosis not present

## 2023-06-22 DIAGNOSIS — I10 Essential (primary) hypertension: Secondary | ICD-10-CM | POA: Diagnosis not present

## 2023-06-22 DIAGNOSIS — I442 Atrioventricular block, complete: Secondary | ICD-10-CM

## 2023-06-22 DIAGNOSIS — Z950 Presence of cardiac pacemaker: Secondary | ICD-10-CM

## 2023-06-22 LAB — CUP PACEART INCLINIC DEVICE CHECK
Battery Remaining Longevity: 138 mo
Battery Voltage: 3.03 V
Brady Statistic AP VP Percent: 42.13 %
Brady Statistic AP VS Percent: 0.57 %
Brady Statistic AS VP Percent: 36.32 %
Brady Statistic AS VS Percent: 20.98 %
Brady Statistic RA Percent Paced: 42.79 %
Brady Statistic RV Percent Paced: 78.45 %
Date Time Interrogation Session: 20240826123955
Implantable Lead Connection Status: 753985
Implantable Lead Connection Status: 753985
Implantable Lead Implant Date: 20230113
Implantable Lead Implant Date: 20230113
Implantable Lead Location: 753859
Implantable Lead Location: 753860
Implantable Lead Model: 3830
Implantable Lead Model: 5076
Implantable Pulse Generator Implant Date: 20230113
Lead Channel Impedance Value: 380 Ohm
Lead Channel Impedance Value: 399 Ohm
Lead Channel Impedance Value: 475 Ohm
Lead Channel Impedance Value: 513 Ohm
Lead Channel Pacing Threshold Amplitude: 0.5 V
Lead Channel Pacing Threshold Amplitude: 0.75 V
Lead Channel Pacing Threshold Pulse Width: 0.4 ms
Lead Channel Pacing Threshold Pulse Width: 0.4 ms
Lead Channel Sensing Intrinsic Amplitude: 4.75 mV
Lead Channel Sensing Intrinsic Amplitude: 5.625 mV
Lead Channel Sensing Intrinsic Amplitude: 7.375 mV
Lead Channel Sensing Intrinsic Amplitude: 7.375 mV
Lead Channel Setting Pacing Amplitude: 1.5 V
Lead Channel Setting Pacing Amplitude: 2 V
Lead Channel Setting Pacing Pulse Width: 0.4 ms
Lead Channel Setting Sensing Sensitivity: 1.2 mV
Zone Setting Status: 755011
Zone Setting Status: 755011

## 2023-06-22 NOTE — Patient Instructions (Signed)
Medication Instructions:   Your physician recommends that you continue on your current medications as directed. Please refer to the Current Medication list given to you today.  *If you need a refill on your cardiac medications before your next appointment, please call your pharmacy*   Lab Work: NONE ORDERED  TODAY   If you have labs (blood work) drawn today and your tests are completely normal, you will receive your results only by: MyChart Message (if you have MyChart) OR A paper copy in the mail If you have any lab test that is abnormal or we need to change your treatment, we will call you to review the results.   Testing/Procedures:. NONE ORDERED  TODAY     Follow-Up: At [facility], you and your health needs are our priority.  As part of our continuing mission to provide you with exceptional heart care, we have created designated Provider Care Teams.  These Care Teams include your primary Cardiologist (physician) and Advanced Practice Providers (APPs -  Physician Assistants and Nurse Practitioners) who all work together to provide you with the care you need, when you need it.  We recommend signing up for the patient portal called "MyChart".  Sign up information is provided on this After Visit Summary.  MyChart is used to connect with patients for Virtual Visits (Telemedicine).  Patients are able to view lab/test results, encounter notes, upcoming appointments, etc.  Non-urgent messages can be sent to your provider as well.   To learn more about what you can do with MyChart, go to [email or web address].    Your next appointment:   1 year(s)  Provider:   You may see Will Jorja Loa, MD    Other Instructions

## 2023-07-14 DIAGNOSIS — Z79899 Other long term (current) drug therapy: Secondary | ICD-10-CM | POA: Diagnosis not present

## 2023-07-14 DIAGNOSIS — R7309 Other abnormal glucose: Secondary | ICD-10-CM | POA: Diagnosis not present

## 2023-07-14 DIAGNOSIS — I10 Essential (primary) hypertension: Secondary | ICD-10-CM | POA: Diagnosis not present

## 2023-07-14 DIAGNOSIS — R7303 Prediabetes: Secondary | ICD-10-CM | POA: Diagnosis not present

## 2023-07-15 DIAGNOSIS — I495 Sick sinus syndrome: Secondary | ICD-10-CM | POA: Diagnosis not present

## 2023-07-15 DIAGNOSIS — E785 Hyperlipidemia, unspecified: Secondary | ICD-10-CM | POA: Diagnosis not present

## 2023-07-15 DIAGNOSIS — Z950 Presence of cardiac pacemaker: Secondary | ICD-10-CM | POA: Diagnosis not present

## 2023-07-15 DIAGNOSIS — I700 Atherosclerosis of aorta: Secondary | ICD-10-CM | POA: Diagnosis not present

## 2023-07-15 DIAGNOSIS — I480 Paroxysmal atrial fibrillation: Secondary | ICD-10-CM | POA: Diagnosis not present

## 2023-07-15 DIAGNOSIS — Z6841 Body Mass Index (BMI) 40.0 and over, adult: Secondary | ICD-10-CM | POA: Diagnosis not present

## 2023-07-15 DIAGNOSIS — R7303 Prediabetes: Secondary | ICD-10-CM | POA: Diagnosis not present

## 2023-07-15 DIAGNOSIS — I10 Essential (primary) hypertension: Secondary | ICD-10-CM | POA: Diagnosis not present

## 2023-07-16 ENCOUNTER — Ambulatory Visit (HOSPITAL_BASED_OUTPATIENT_CLINIC_OR_DEPARTMENT_OTHER): Payer: Medicare PPO | Admitting: Cardiology

## 2023-07-16 ENCOUNTER — Encounter (HOSPITAL_BASED_OUTPATIENT_CLINIC_OR_DEPARTMENT_OTHER): Payer: Self-pay | Admitting: Cardiology

## 2023-07-16 VITALS — BP 142/76 | HR 64 | Ht 65.5 in | Wt 272.0 lb

## 2023-07-16 DIAGNOSIS — I10 Essential (primary) hypertension: Secondary | ICD-10-CM

## 2023-07-16 DIAGNOSIS — D6869 Other thrombophilia: Secondary | ICD-10-CM

## 2023-07-16 DIAGNOSIS — I442 Atrioventricular block, complete: Secondary | ICD-10-CM | POA: Diagnosis not present

## 2023-07-16 DIAGNOSIS — Z950 Presence of cardiac pacemaker: Secondary | ICD-10-CM

## 2023-07-16 DIAGNOSIS — I447 Left bundle-branch block, unspecified: Secondary | ICD-10-CM | POA: Diagnosis not present

## 2023-07-16 DIAGNOSIS — I480 Paroxysmal atrial fibrillation: Secondary | ICD-10-CM

## 2023-07-16 MED ORDER — APIXABAN 5 MG PO TABS: 5.0000 mg | ORAL_TABLET | Freq: Two times a day (BID) | ORAL | 3 refills | Status: DC

## 2023-07-16 NOTE — Patient Instructions (Signed)

## 2023-07-16 NOTE — Progress Notes (Signed)
Cardiology Office Note:  .   Date:  07/16/2023  ID:  [name], DOB 1945-12-30, MRN [id number] PCP: Gweneth Dimitri, MD  [facility] Providers Cardiologist:  Donato Schultz, MD Electrophysiologist:  Will Jorja Loa, MD    History of Present Illness: .   Alexandra Rogers is a 77 y.o. female here for follow-up paroxysmal atrial fibrillation, pacemaker, complete heart block, Dr. Elberta Fortis, former Dr. Katrinka Blazing patient.  About to travel to [place] and [place]  Discussed the use of AI scribe software for clinical note transcription with the patient, who gave verbal consent to proceed.  History of Present Illness    A 77 year old patient with a history of atrial fibrillation, left bundle branch block, hypertension, hyperlipidemia, complete heart block with pacemaker placement, and obstructive sleep apnea presents for a follow-up visit. The patient is not currently on CPAP therapy. The patient's pacemaker, implanted in 2023, appears to be functioning well, with a heart rate of 60 and AV pacing noted on a recent EKG. The patient has been on Eliquis 5 mg twice a day for anticoagulation and Valsartan hydrochlorothiazide 320/12.5 mg daily for hypertension. The patient reports feeling well overall, with the exception of knee pain due to lack of cartilage. The patient is managing the knee pain with expensive shoes and occasional use of Aleve. The patient also mentions a struggle with weight management, with food being their "drug of choice." The patient is planning a month-long trip to [place], [place], and [place].      ROS: no CP  Studies Reviewed: Marland Kitchen        LABS HbA1c: 5.9 Creatinine: 0.9 ALT: 18 Hemoglobin: 14 LDL: 65 (12/25/2022) TSH: 2.9  DIAGNOSTIC EKG: Heart rate 60, AV pacing (06/22/2023)  Risk Assessment/Calculations:           Physical Exam:   VS:  BP (!) 142/76   Pulse 64   Ht 5' 5.5" (1.664 m)   Wt 272 lb (123.4 kg)   SpO2 96%   BMI 44.57 kg/m     Wt Readings from Last 3 Encounters:  07/16/23 272 lb (123.4 kg)  06/22/23 275 lb 12.8 oz (125.1 kg)  06/13/22 282 lb (127.9 kg)    GEN: Well nourished, well developed in no acute distress NECK: No JVD; No carotid bruits CARDIAC: RRR, no murmurs, rubs, gallops RESPIRATORY:  Clear to auscultation without rales, wheezing or rhonchi  ABDOMEN: Soft, non-tender, non-distended EXTREMITIES:  No edema; No deformity   ASSESSMENT AND PLAN: .    Assessment and Plan    Parox Atrial Fibrillation Stable on Eliquis 5mg  BID for stroke prevention. CHADSVASC score is 3. Pacemaker implanted on 11/08/21 is functioning well. -Continue Eliquis 5mg  BID.  Hypertension Stable on Valsartan/Hydrochlorothiazide 320/12.5mg  daily. -Continue Valsartan/Hydrochlorothiazide 320/12.5mg  daily.  Hyperlipidemia LDL controlled at 65 on Rosuvastatin 5mg  daily. -Continue Rosuvastatin 5mg  daily.  Knee Osteoarthritis Reports no cartilage in knees, causing discomfort. Using cushioned shoes for relief. Considering knee replacement in the future. -Continue current management. Consider cortisone injections as needed.  Weight Management Discussed potential use of Ozempic for weight management. Patient is open to exploring this option. -Consult with pharmacy team regarding potential use and cost of Ozempic.  General Health Maintenance -Continue Metoprolol ER 100mg  daily. -Check labs as needed to monitor A1C, creatinine, liver function, and hemoglobin levels.            Dispo: 1 yr  Signed, Donato Schultz, MD

## 2023-07-23 DIAGNOSIS — M170 Bilateral primary osteoarthritis of knee: Secondary | ICD-10-CM | POA: Diagnosis not present

## 2023-08-10 ENCOUNTER — Ambulatory Visit (INDEPENDENT_AMBULATORY_CARE_PROVIDER_SITE_OTHER): Payer: Medicare PPO

## 2023-08-10 DIAGNOSIS — I442 Atrioventricular block, complete: Secondary | ICD-10-CM

## 2023-08-10 DIAGNOSIS — I495 Sick sinus syndrome: Secondary | ICD-10-CM

## 2023-08-12 LAB — CUP PACEART REMOTE DEVICE CHECK
Battery Remaining Longevity: 136 mo
Battery Voltage: 3.03 V
Brady Statistic AP VP Percent: 45.33 %
Brady Statistic AP VS Percent: 0.02 %
Brady Statistic AS VP Percent: 54.35 %
Brady Statistic AS VS Percent: 0.3 %
Brady Statistic RA Percent Paced: 45.46 %
Brady Statistic RV Percent Paced: 99.68 %
Date Time Interrogation Session: 20241014022040
Implantable Lead Connection Status: 753985
Implantable Lead Connection Status: 753985
Implantable Lead Implant Date: 20230113
Implantable Lead Implant Date: 20230113
Implantable Lead Location: 753859
Implantable Lead Location: 753860
Implantable Lead Model: 3830
Implantable Lead Model: 5076
Implantable Pulse Generator Implant Date: 20230113
Lead Channel Impedance Value: 380 Ohm
Lead Channel Impedance Value: 399 Ohm
Lead Channel Impedance Value: 475 Ohm
Lead Channel Impedance Value: 513 Ohm
Lead Channel Pacing Threshold Amplitude: 0.5 V
Lead Channel Pacing Threshold Amplitude: 0.75 V
Lead Channel Pacing Threshold Pulse Width: 0.4 ms
Lead Channel Pacing Threshold Pulse Width: 0.4 ms
Lead Channel Sensing Intrinsic Amplitude: 4.75 mV
Lead Channel Sensing Intrinsic Amplitude: 4.75 mV
Lead Channel Sensing Intrinsic Amplitude: 6.25 mV
Lead Channel Sensing Intrinsic Amplitude: 6.25 mV
Lead Channel Setting Pacing Amplitude: 1.5 V
Lead Channel Setting Pacing Amplitude: 2 V
Lead Channel Setting Pacing Pulse Width: 0.4 ms
Lead Channel Setting Sensing Sensitivity: 1.2 mV
Zone Setting Status: 755011
Zone Setting Status: 755011

## 2023-08-26 NOTE — Progress Notes (Signed)
Remote pacemaker transmission.   

## 2023-09-24 DIAGNOSIS — I10 Essential (primary) hypertension: Secondary | ICD-10-CM | POA: Diagnosis not present

## 2023-09-24 DIAGNOSIS — S60222A Contusion of left hand, initial encounter: Secondary | ICD-10-CM | POA: Diagnosis not present

## 2023-09-24 DIAGNOSIS — S60022A Contusion of left index finger without damage to nail, initial encounter: Secondary | ICD-10-CM | POA: Diagnosis not present

## 2023-09-24 DIAGNOSIS — S6992XA Unspecified injury of left wrist, hand and finger(s), initial encounter: Secondary | ICD-10-CM | POA: Diagnosis not present

## 2023-09-24 DIAGNOSIS — S60042A Contusion of left ring finger without damage to nail, initial encounter: Secondary | ICD-10-CM | POA: Diagnosis not present

## 2023-09-24 DIAGNOSIS — S60052A Contusion of left little finger without damage to nail, initial encounter: Secondary | ICD-10-CM | POA: Diagnosis not present

## 2023-09-24 DIAGNOSIS — Z7901 Long term (current) use of anticoagulants: Secondary | ICD-10-CM | POA: Diagnosis not present

## 2023-11-08 LAB — CUP PACEART REMOTE DEVICE CHECK
Battery Remaining Longevity: 131 mo
Battery Voltage: 3.03 V
Brady Statistic AP VP Percent: 48.9 %
Brady Statistic AP VS Percent: 0.01 %
Brady Statistic AS VP Percent: 50.74 %
Brady Statistic AS VS Percent: 0.34 %
Brady Statistic RA Percent Paced: 49 %
Brady Statistic RV Percent Paced: 99.64 %
Date Time Interrogation Session: 20250112190146
Implantable Lead Connection Status: 753985
Implantable Lead Connection Status: 753985
Implantable Lead Implant Date: 20230113
Implantable Lead Implant Date: 20230113
Implantable Lead Location: 753859
Implantable Lead Location: 753860
Implantable Lead Model: 3830
Implantable Lead Model: 5076
Implantable Pulse Generator Implant Date: 20230113
Lead Channel Impedance Value: 361 Ohm
Lead Channel Impedance Value: 380 Ohm
Lead Channel Impedance Value: 456 Ohm
Lead Channel Impedance Value: 475 Ohm
Lead Channel Pacing Threshold Amplitude: 0.5 V
Lead Channel Pacing Threshold Amplitude: 0.75 V
Lead Channel Pacing Threshold Pulse Width: 0.4 ms
Lead Channel Pacing Threshold Pulse Width: 0.4 ms
Lead Channel Sensing Intrinsic Amplitude: 4.25 mV
Lead Channel Sensing Intrinsic Amplitude: 4.25 mV
Lead Channel Sensing Intrinsic Amplitude: 7.125 mV
Lead Channel Sensing Intrinsic Amplitude: 7.125 mV
Lead Channel Setting Pacing Amplitude: 1.5 V
Lead Channel Setting Pacing Amplitude: 2 V
Lead Channel Setting Pacing Pulse Width: 0.4 ms
Lead Channel Setting Sensing Sensitivity: 1.2 mV
Zone Setting Status: 755011
Zone Setting Status: 755011

## 2023-11-09 ENCOUNTER — Ambulatory Visit (INDEPENDENT_AMBULATORY_CARE_PROVIDER_SITE_OTHER): Payer: Medicare PPO

## 2023-11-09 DIAGNOSIS — I442 Atrioventricular block, complete: Secondary | ICD-10-CM | POA: Diagnosis not present

## 2023-11-26 DIAGNOSIS — Z6841 Body Mass Index (BMI) 40.0 and over, adult: Secondary | ICD-10-CM | POA: Diagnosis not present

## 2023-11-26 DIAGNOSIS — B354 Tinea corporis: Secondary | ICD-10-CM | POA: Diagnosis not present

## 2023-11-26 DIAGNOSIS — Z0000 Encounter for general adult medical examination without abnormal findings: Secondary | ICD-10-CM | POA: Diagnosis not present

## 2023-12-08 DIAGNOSIS — Z1212 Encounter for screening for malignant neoplasm of rectum: Secondary | ICD-10-CM | POA: Diagnosis not present

## 2023-12-08 DIAGNOSIS — Z1211 Encounter for screening for malignant neoplasm of colon: Secondary | ICD-10-CM | POA: Diagnosis not present

## 2023-12-14 LAB — COLOGUARD: COLOGUARD: NEGATIVE

## 2023-12-22 NOTE — Progress Notes (Signed)
 Remote pacemaker transmission.

## 2024-01-07 DIAGNOSIS — R7303 Prediabetes: Secondary | ICD-10-CM | POA: Diagnosis not present

## 2024-01-07 DIAGNOSIS — E785 Hyperlipidemia, unspecified: Secondary | ICD-10-CM | POA: Diagnosis not present

## 2024-01-07 DIAGNOSIS — Z79899 Other long term (current) drug therapy: Secondary | ICD-10-CM | POA: Diagnosis not present

## 2024-01-12 DIAGNOSIS — E785 Hyperlipidemia, unspecified: Secondary | ICD-10-CM | POA: Diagnosis not present

## 2024-01-12 DIAGNOSIS — I10 Essential (primary) hypertension: Secondary | ICD-10-CM | POA: Diagnosis not present

## 2024-01-12 DIAGNOSIS — K219 Gastro-esophageal reflux disease without esophagitis: Secondary | ICD-10-CM | POA: Diagnosis not present

## 2024-01-12 DIAGNOSIS — R7303 Prediabetes: Secondary | ICD-10-CM | POA: Diagnosis not present

## 2024-01-12 DIAGNOSIS — Z6841 Body Mass Index (BMI) 40.0 and over, adult: Secondary | ICD-10-CM | POA: Diagnosis not present

## 2024-01-12 DIAGNOSIS — I480 Paroxysmal atrial fibrillation: Secondary | ICD-10-CM | POA: Diagnosis not present

## 2024-01-12 DIAGNOSIS — N950 Postmenopausal bleeding: Secondary | ICD-10-CM | POA: Diagnosis not present

## 2024-02-08 ENCOUNTER — Ambulatory Visit (INDEPENDENT_AMBULATORY_CARE_PROVIDER_SITE_OTHER): Payer: Medicare PPO

## 2024-02-08 DIAGNOSIS — I442 Atrioventricular block, complete: Secondary | ICD-10-CM | POA: Diagnosis not present

## 2024-02-10 LAB — CUP PACEART REMOTE DEVICE CHECK
Battery Remaining Longevity: 127 mo
Battery Voltage: 3.02 V
Brady Statistic AP VP Percent: 51.79 %
Brady Statistic AP VS Percent: 0.01 %
Brady Statistic AS VP Percent: 48.02 %
Brady Statistic AS VS Percent: 0.18 %
Brady Statistic RA Percent Paced: 51.83 %
Brady Statistic RV Percent Paced: 99.81 %
Date Time Interrogation Session: 20250414113426
Implantable Lead Connection Status: 753985
Implantable Lead Connection Status: 753985
Implantable Lead Implant Date: 20230113
Implantable Lead Implant Date: 20230113
Implantable Lead Location: 753859
Implantable Lead Location: 753860
Implantable Lead Model: 3830
Implantable Lead Model: 5076
Implantable Pulse Generator Implant Date: 20230113
Lead Channel Impedance Value: 361 Ohm
Lead Channel Impedance Value: 380 Ohm
Lead Channel Impedance Value: 475 Ohm
Lead Channel Impedance Value: 494 Ohm
Lead Channel Pacing Threshold Amplitude: 0.5 V
Lead Channel Pacing Threshold Amplitude: 0.75 V
Lead Channel Pacing Threshold Pulse Width: 0.4 ms
Lead Channel Pacing Threshold Pulse Width: 0.4 ms
Lead Channel Sensing Intrinsic Amplitude: 4.25 mV
Lead Channel Sensing Intrinsic Amplitude: 4.25 mV
Lead Channel Sensing Intrinsic Amplitude: 7 mV
Lead Channel Sensing Intrinsic Amplitude: 7 mV
Lead Channel Setting Pacing Amplitude: 1.5 V
Lead Channel Setting Pacing Amplitude: 2 V
Lead Channel Setting Pacing Pulse Width: 0.4 ms
Lead Channel Setting Sensing Sensitivity: 1.2 mV
Zone Setting Status: 755011
Zone Setting Status: 755011

## 2024-02-11 DIAGNOSIS — M1712 Unilateral primary osteoarthritis, left knee: Secondary | ICD-10-CM | POA: Diagnosis not present

## 2024-02-11 DIAGNOSIS — M170 Bilateral primary osteoarthritis of knee: Secondary | ICD-10-CM | POA: Diagnosis not present

## 2024-02-11 DIAGNOSIS — M1711 Unilateral primary osteoarthritis, right knee: Secondary | ICD-10-CM | POA: Diagnosis not present

## 2024-02-11 DIAGNOSIS — M25561 Pain in right knee: Secondary | ICD-10-CM | POA: Diagnosis not present

## 2024-03-22 ENCOUNTER — Other Ambulatory Visit: Payer: Self-pay | Admitting: Family Medicine

## 2024-03-22 DIAGNOSIS — M25561 Pain in right knee: Secondary | ICD-10-CM | POA: Diagnosis not present

## 2024-03-22 DIAGNOSIS — Z1231 Encounter for screening mammogram for malignant neoplasm of breast: Secondary | ICD-10-CM

## 2024-03-30 NOTE — Progress Notes (Signed)
 Remote pacemaker transmission.

## 2024-03-30 NOTE — Addendum Note (Signed)
 Addended by: Edra Govern D on: 03/30/2024 12:27 PM   Modules accepted: Orders

## 2024-04-13 DIAGNOSIS — Z6841 Body Mass Index (BMI) 40.0 and over, adult: Secondary | ICD-10-CM | POA: Diagnosis not present

## 2024-04-13 DIAGNOSIS — M170 Bilateral primary osteoarthritis of knee: Secondary | ICD-10-CM | POA: Diagnosis not present

## 2024-04-22 ENCOUNTER — Ambulatory Visit
Admission: RE | Admit: 2024-04-22 | Discharge: 2024-04-22 | Disposition: A | Discharge status: Home / Self Care | Source: Ambulatory Visit | Attending: Family Medicine | Admitting: Family Medicine

## 2024-04-22 DIAGNOSIS — Z1231 Encounter for screening mammogram for malignant neoplasm of breast: Secondary | ICD-10-CM

## 2024-05-09 ENCOUNTER — Ambulatory Visit: Payer: Medicare PPO

## 2024-05-09 DIAGNOSIS — I442 Atrioventricular block, complete: Secondary | ICD-10-CM

## 2024-05-10 LAB — CUP PACEART REMOTE DEVICE CHECK
Battery Remaining Longevity: 125 mo
Battery Voltage: 3.02 V
Brady Statistic AP VP Percent: 47.88 %
Brady Statistic AP VS Percent: 0.01 %
Brady Statistic AS VP Percent: 51.9 %
Brady Statistic AS VS Percent: 0.21 %
Brady Statistic RA Percent Paced: 47.96 %
Brady Statistic RV Percent Paced: 99.78 %
Date Time Interrogation Session: 20250714110305
Implantable Lead Connection Status: 753985
Implantable Lead Connection Status: 753985
Implantable Lead Implant Date: 20230113
Implantable Lead Implant Date: 20230113
Implantable Lead Location: 753859
Implantable Lead Location: 753860
Implantable Lead Model: 3830
Implantable Lead Model: 5076
Implantable Pulse Generator Implant Date: 20230113
Lead Channel Impedance Value: 342 Ohm
Lead Channel Impedance Value: 380 Ohm
Lead Channel Impedance Value: 494 Ohm
Lead Channel Impedance Value: 513 Ohm
Lead Channel Pacing Threshold Amplitude: 0.625 V
Lead Channel Pacing Threshold Amplitude: 0.75 V
Lead Channel Pacing Threshold Pulse Width: 0.4 ms
Lead Channel Pacing Threshold Pulse Width: 0.4 ms
Lead Channel Sensing Intrinsic Amplitude: 4 mV
Lead Channel Sensing Intrinsic Amplitude: 4 mV
Lead Channel Sensing Intrinsic Amplitude: 6.25 mV
Lead Channel Sensing Intrinsic Amplitude: 6.25 mV
Lead Channel Setting Pacing Amplitude: 1.5 V
Lead Channel Setting Pacing Amplitude: 2 V
Lead Channel Setting Pacing Pulse Width: 0.4 ms
Lead Channel Setting Sensing Sensitivity: 1.2 mV
Zone Setting Status: 755011
Zone Setting Status: 755011

## 2024-05-11 ENCOUNTER — Ambulatory Visit: Payer: Self-pay | Admitting: Cardiology

## 2024-05-24 DIAGNOSIS — D23111 Other benign neoplasm of skin of right upper eyelid, including canthus: Secondary | ICD-10-CM | POA: Diagnosis not present

## 2024-05-24 DIAGNOSIS — H3500 Unspecified background retinopathy: Secondary | ICD-10-CM | POA: Diagnosis not present

## 2024-05-24 DIAGNOSIS — D23112 Other benign neoplasm of skin of right lower eyelid, including canthus: Secondary | ICD-10-CM | POA: Diagnosis not present

## 2024-05-24 DIAGNOSIS — Z961 Presence of intraocular lens: Secondary | ICD-10-CM | POA: Diagnosis not present

## 2024-05-24 DIAGNOSIS — D23121 Other benign neoplasm of skin of left upper eyelid, including canthus: Secondary | ICD-10-CM | POA: Diagnosis not present

## 2024-07-23 DIAGNOSIS — U071 COVID-19: Secondary | ICD-10-CM | POA: Diagnosis not present

## 2024-08-04 NOTE — Progress Notes (Signed)
 Remote PPM Transmission

## 2024-08-08 ENCOUNTER — Ambulatory Visit: Payer: Medicare PPO

## 2024-08-08 DIAGNOSIS — I442 Atrioventricular block, complete: Secondary | ICD-10-CM

## 2024-08-08 LAB — CUP PACEART REMOTE DEVICE CHECK
Battery Remaining Longevity: 120 mo
Battery Voltage: 3.02 V
Brady Statistic AP VP Percent: 49 %
Brady Statistic AP VS Percent: 0.01 %
Brady Statistic AS VP Percent: 50.68 %
Brady Statistic AS VS Percent: 0.32 %
Brady Statistic RA Percent Paced: 49.21 %
Brady Statistic RV Percent Paced: 99.68 %
Date Time Interrogation Session: 20251013105613
Implantable Lead Connection Status: 753985
Implantable Lead Connection Status: 753985
Implantable Lead Implant Date: 20230113
Implantable Lead Implant Date: 20230113
Implantable Lead Location: 753859
Implantable Lead Location: 753860
Implantable Lead Model: 3830
Implantable Lead Model: 5076
Implantable Pulse Generator Implant Date: 20230113
Lead Channel Impedance Value: 342 Ohm
Lead Channel Impedance Value: 361 Ohm
Lead Channel Impedance Value: 475 Ohm
Lead Channel Impedance Value: 475 Ohm
Lead Channel Pacing Threshold Amplitude: 0.375 V
Lead Channel Pacing Threshold Amplitude: 0.75 V
Lead Channel Pacing Threshold Pulse Width: 0.4 ms
Lead Channel Pacing Threshold Pulse Width: 0.4 ms
Lead Channel Sensing Intrinsic Amplitude: 4.375 mV
Lead Channel Sensing Intrinsic Amplitude: 4.375 mV
Lead Channel Sensing Intrinsic Amplitude: 7.125 mV
Lead Channel Sensing Intrinsic Amplitude: 7.125 mV
Lead Channel Setting Pacing Amplitude: 1.5 V
Lead Channel Setting Pacing Amplitude: 2 V
Lead Channel Setting Pacing Pulse Width: 0.4 ms
Lead Channel Setting Sensing Sensitivity: 1.2 mV
Zone Setting Status: 755011
Zone Setting Status: 755011

## 2024-08-09 DIAGNOSIS — R7303 Prediabetes: Secondary | ICD-10-CM | POA: Diagnosis not present

## 2024-08-09 DIAGNOSIS — I480 Paroxysmal atrial fibrillation: Secondary | ICD-10-CM | POA: Diagnosis not present

## 2024-08-09 DIAGNOSIS — R296 Repeated falls: Secondary | ICD-10-CM | POA: Diagnosis not present

## 2024-08-09 DIAGNOSIS — I10 Essential (primary) hypertension: Secondary | ICD-10-CM | POA: Diagnosis not present

## 2024-08-09 DIAGNOSIS — E785 Hyperlipidemia, unspecified: Secondary | ICD-10-CM | POA: Diagnosis not present

## 2024-08-09 DIAGNOSIS — R29898 Other symptoms and signs involving the musculoskeletal system: Secondary | ICD-10-CM | POA: Diagnosis not present

## 2024-08-09 DIAGNOSIS — F439 Reaction to severe stress, unspecified: Secondary | ICD-10-CM | POA: Diagnosis not present

## 2024-08-09 DIAGNOSIS — G4733 Obstructive sleep apnea (adult) (pediatric): Secondary | ICD-10-CM | POA: Diagnosis not present

## 2024-08-10 LAB — LAB REPORT - SCANNED
A1c: 6.1
Creatinine, POC: 89 mg/dL
EGFR: 68
Microalb Creat Ratio: 7.9
Microalbumin, Urine: 0.7

## 2024-08-10 NOTE — Progress Notes (Signed)
 Remote PPM Transmission

## 2024-08-16 ENCOUNTER — Ambulatory Visit: Attending: Student | Admitting: Student

## 2024-08-16 ENCOUNTER — Encounter: Payer: Self-pay | Admitting: Student

## 2024-08-16 VITALS — BP 160/77 | HR 65 | Ht 65.5 in | Wt 288.8 lb

## 2024-08-16 DIAGNOSIS — I442 Atrioventricular block, complete: Secondary | ICD-10-CM

## 2024-08-16 DIAGNOSIS — I480 Paroxysmal atrial fibrillation: Secondary | ICD-10-CM

## 2024-08-16 DIAGNOSIS — I10 Essential (primary) hypertension: Secondary | ICD-10-CM

## 2024-08-16 DIAGNOSIS — I495 Sick sinus syndrome: Secondary | ICD-10-CM

## 2024-08-16 LAB — CUP PACEART INCLINIC DEVICE CHECK
Battery Remaining Longevity: 121 mo
Battery Voltage: 3.02 V
Brady Statistic AP VP Percent: 48.86 %
Brady Statistic AP VS Percent: 0.01 %
Brady Statistic AS VP Percent: 50.86 %
Brady Statistic AS VS Percent: 0.27 %
Brady Statistic RA Percent Paced: 48.97 %
Brady Statistic RV Percent Paced: 99.72 %
Date Time Interrogation Session: 20251021110513
Implantable Lead Connection Status: 753985
Implantable Lead Connection Status: 753985
Implantable Lead Implant Date: 20230113
Implantable Lead Implant Date: 20230113
Implantable Lead Location: 753859
Implantable Lead Location: 753860
Implantable Lead Model: 3830
Implantable Lead Model: 5076
Implantable Pulse Generator Implant Date: 20230113
Lead Channel Impedance Value: 380 Ohm
Lead Channel Impedance Value: 399 Ohm
Lead Channel Impedance Value: 475 Ohm
Lead Channel Impedance Value: 513 Ohm
Lead Channel Pacing Threshold Amplitude: 0.375 V
Lead Channel Pacing Threshold Amplitude: 0.875 V
Lead Channel Pacing Threshold Pulse Width: 0.4 ms
Lead Channel Pacing Threshold Pulse Width: 0.4 ms
Lead Channel Sensing Intrinsic Amplitude: 4.625 mV
Lead Channel Sensing Intrinsic Amplitude: 5 mV
Lead Channel Sensing Intrinsic Amplitude: 7.125 mV
Lead Channel Sensing Intrinsic Amplitude: 7.125 mV
Lead Channel Setting Pacing Amplitude: 1.5 V
Lead Channel Setting Pacing Amplitude: 2 V
Lead Channel Setting Pacing Pulse Width: 0.4 ms
Lead Channel Setting Sensing Sensitivity: 1.2 mV
Zone Setting Status: 755011
Zone Setting Status: 755011

## 2024-08-16 NOTE — Progress Notes (Signed)
  Electrophysiology Office Note:   ID:  Alexandra Rogers, DOB 1945/12/30, MRN [id number]  Primary Cardiologist: Oneil Parchment, MD Electrophysiologist: Soyla Gladis Norton, MD      History of Present Illness:   Alexandra Rogers is a 78 y.o. female with h/o CHB s/p PPM, PAF, HTN, and LBB seen today for routine electrophysiology followup.   Since last being seen in our clinic the patient reports doing OK from a cardiac perspective. Very stressed by the state of the world, has had more palpitations. Otherwise, she denies chest pain,  dyspnea, PND, orthopnea, nausea, vomiting, dizziness, syncope, edema, weight gain, or early satiety.   Review of systems complete and found to be negative unless listed in HPI.   EP Information / Studies Reviewed:    EKG is ordered today. Personal review as below.       PPM Interrogation-  reviewed in detail today,  See PACEART report.  Arrhythmia/Device History MDT dual chamber PPM implanted 11/08/21   Physical Exam:   VS:  BP (!) 160/77 (BP Location: Left Arm, Patient Position: Sitting, Cuff Size: Large)   Pulse 65   Ht 5' 5.5 (1.664 m)   Wt 288 lb 12.8 oz (131 kg)   SpO2 98%   BMI 47.33 kg/m    Wt Readings from Last 3 Encounters:  08/16/24 288 lb 12.8 oz (131 kg)  07/16/23 272 lb (123.4 kg)  06/22/23 275 lb 12.8 oz (125.1 kg)     GEN: No acute distress  NECK: No JVD; No carotid bruits CARDIAC: Regular rate and rhythm, no murmurs, rubs, gallops RESPIRATORY:  Clear to auscultation without rales, wheezing or rhonchi  ABDOMEN: Soft, non-tender, non-distended EXTREMITIES:  No edema; No deformity   ASSESSMENT AND PLAN:    CHB s/p Medtronic PPM  Normal PPM function See Pace Art report No changes today  Paroxysmal AF NSVT EKG today shows NSR Continue eliquis  5 mg BID for CHA2DS2VASc of at least 3 Move toprol  to bedtime. If palpitations continue, would consider adding a 50 mg dose to the morning.   HTN Stable on current regimen    Secondary hypercoagulable state Pt on Eliquis  as above     Disposition:   Follow up with EP Team in 12 months, sooner with more arrhythmia   Signed, Ozell Prentice Passey, PA-C

## 2024-08-16 NOTE — Patient Instructions (Signed)
 Medication Instructions:  Your physician recommends that you continue on your current medications as directed. Please refer to the Current Medication list given to you today.  *If you need a refill on your cardiac medications before your next appointment, please call your pharmacy*  Lab Work: None ordered If you have labs (blood work) drawn today and your tests are completely normal, you will receive your results only by: MyChart Message (if you have MyChart) OR A paper copy in the mail If you have any lab test that is abnormal or we need to change your treatment, we will call you to review the results.  Follow-Up: At [facility], you and your health needs are our priority.  As part of our continuing mission to provide you with exceptional heart care, our providers are all part of one team.  This team includes your primary Cardiologist (physician) and Advanced Practice Providers or APPs (Physician Assistants and Nurse Practitioners) who all work together to provide you with the care you need, when you need it.  Your next appointment:   1 year(s)  Provider:   Agatha Horsfall, MD or Joycelyn Noa" [place], PA-C

## 2024-08-23 ENCOUNTER — Ambulatory Visit: Payer: Self-pay | Admitting: Cardiology

## 2024-08-26 DIAGNOSIS — F4323 Adjustment disorder with mixed anxiety and depressed mood: Secondary | ICD-10-CM | POA: Diagnosis not present

## 2024-09-06 DIAGNOSIS — M6281 Muscle weakness (generalized): Secondary | ICD-10-CM | POA: Diagnosis not present

## 2024-09-06 DIAGNOSIS — M25562 Pain in left knee: Secondary | ICD-10-CM | POA: Diagnosis not present

## 2024-09-06 DIAGNOSIS — M25561 Pain in right knee: Secondary | ICD-10-CM | POA: Diagnosis not present

## 2024-09-08 ENCOUNTER — Encounter (HOSPITAL_BASED_OUTPATIENT_CLINIC_OR_DEPARTMENT_OTHER): Payer: Self-pay | Admitting: Cardiology

## 2024-09-08 ENCOUNTER — Ambulatory Visit (HOSPITAL_BASED_OUTPATIENT_CLINIC_OR_DEPARTMENT_OTHER): Admitting: Cardiology

## 2024-09-08 VITALS — BP 128/84 | HR 69 | Ht 65.5 in | Wt 289.9 lb

## 2024-09-08 DIAGNOSIS — I442 Atrioventricular block, complete: Secondary | ICD-10-CM

## 2024-09-08 DIAGNOSIS — I480 Paroxysmal atrial fibrillation: Secondary | ICD-10-CM

## 2024-09-08 DIAGNOSIS — E669 Obesity, unspecified: Secondary | ICD-10-CM

## 2024-09-08 DIAGNOSIS — I447 Left bundle-branch block, unspecified: Secondary | ICD-10-CM | POA: Diagnosis not present

## 2024-09-08 DIAGNOSIS — Z950 Presence of cardiac pacemaker: Secondary | ICD-10-CM | POA: Diagnosis not present

## 2024-09-08 DIAGNOSIS — Z7901 Long term (current) use of anticoagulants: Secondary | ICD-10-CM | POA: Diagnosis not present

## 2024-09-08 DIAGNOSIS — I495 Sick sinus syndrome: Secondary | ICD-10-CM

## 2024-09-08 MED ORDER — METOPROLOL SUCCINATE ER 50 MG PO TB24: 50.0000 mg | ORAL_TABLET | Freq: Every day | ORAL | 3 refills | Status: AC

## 2024-09-08 MED ORDER — APIXABAN 5 MG PO TABS: 5.0000 mg | ORAL_TABLET | Freq: Two times a day (BID) | ORAL | 3 refills | Status: AC

## 2024-09-08 NOTE — Progress Notes (Signed)
 Cardiology Office Note:  .   Date:  09/08/2024  ID:  Alexandra Rogers, DOB 1946-03-23, MRN [id number] PCP: Aisha Harvey, MD  [facility] Providers Cardiologist:  Oneil Parchment, MD Electrophysiologist:  Will Gladis Norton, MD     History of Present Illness: .   Alexandra Rogers is a 78 y.o. female Discussed the use of AI scribe  History of Present Illness Alexandra Rogers is a 78 year old female with complete heart block, pacemaker, paroxysmal atrial fibrillation, and hypertension who presents for a follow-up visit.  She experiences palpitations, particularly during periods of increased stress. Occasionally, she feels lightheaded in the morning, describing it as her heart feeling 'lightheaded'.  She has a Medtronic dual chamber pacemaker implanted on November 08, 2021. Her last EKG on August 16, 2024, showed atrial sense and ventricular paced rhythm at 65 beats per minute.  She is currently taking Eliquis  5 mg twice a day due to a CHADS-VASc score of three. She also takes metoprolol  100 mg at night, which she has switched to taking at dinnertime to help with the timing of its effects. This change has helped lessen her morning symptoms, although she still experiences some lightheadedness upon changing positions.  She has a history of stress related to family issues, including her best friend's recent cancer surgery, her son's job loss, and family dynamics. Stress may contribute to her symptoms.  She occasionally takes Aleve for knee pain, which she uses sparingly due to the risk of bleeding associated with Eliquis . She also mentions having a prescription for tramadol for pain management, which she plans to coordinate with her primary care provider.      Studies Reviewed: .        Results LABS LDL: 65 mg/dL Creatinine: 0.9 mg/dL  DIAGNOSTIC EKG: Atrial sensed, ventricular paced rhythm, 65 beats per minute (08/16/2024) Risk Assessment/Calculations:             Physical Exam:   VS:  BP 128/84   Pulse 69   Ht 5' 5.5 (1.664 m)   Wt 289 lb 14.4 oz (131.5 kg)   SpO2 97%   BMI 47.51 kg/m    Wt Readings from Last 3 Encounters:  09/08/24 289 lb 14.4 oz (131.5 kg)  08/16/24 288 lb 12.8 oz (131 kg)  07/16/23 272 lb (123.4 kg)    GEN: Well nourished, well developed in no acute distress NECK: No JVD; No carotid bruits CARDIAC: RRR, no murmurs, no rubs, no gallops RESPIRATORY:  Clear to auscultation without rales, wheezing or rhonchi  ABDOMEN: Soft, non-tender, non-distended EXTREMITIES:  No edema; No deformity   ASSESSMENT AND PLAN: .    Assessment and Plan Assessment & Plan Paroxysmal atrial fibrillation, anticoagulation management, and stroke risk reduction Paroxysmal atrial fibrillation is well-managed with no recent episodes since pacemaker implantation. CHADS-VASc score is 3, necessitating lifelong anticoagulation. Eliquis  is continued for stroke risk reduction, maintaining stroke risk at 1-2%. Discussed Watchman device as an alternative for those with bleeding concerns, but not applicable due to well-managed condition and lack of bleeding issues. - Continue Eliquis  5 mg BID - Refilled Eliquis  prescription  Complete heart block, status post dual chamber pacemaker Complete heart block is managed with a Medtronic dual chamber pacemaker implanted on January 13th, 2023. Recent EKG on October 21st, 2025, shows atrial sense, ventricular paced rhythm at 65 bpm, indicating effective pacemaker function. Reports occasional lightheadedness in the morning, possibly related to metoprolol  timing. Stress may exacerbate symptoms due to increased adrenaline levels. -  Adjusted metoprolol  to 100 mg at night and added 50 mg in the morning to assess symptom improvement.  If no change is felt, she is at liberty to discontinue the 50 mg in the morning no addition.  She will message me if this is the case.  Hypertension Blood pressure is well-controlled with current  medication regimen. - Continue current antihypertensive regimen  Left bundle branch block - stable         Dispo: 1 yr  Signed, Oneil Parchment, MD

## 2024-09-08 NOTE — Patient Instructions (Signed)
 Medication Instructions:  Your physician has recommended you make the following change in your medication:  1.) add Toprol  XL 50 mg - take one tablet daily in the morning. Continue the evening dose as well  *If you need a refill on your cardiac medications before your next appointment, please call your pharmacy*  Lab Work: none  Testing/Procedures: none  Follow-Up: At [facility], you and your health needs are our priority.  As part of our continuing mission to provide you with exceptional heart care, our providers are all part of one team.  This team includes your primary Cardiologist (physician) and Advanced Practice Providers or APPs (Physician Assistants and Nurse Practitioners) who all work together to provide you with the care you need, when you need it.  Your next appointment:   12 month(s)  Provider:   Oneil Parchment, MD, Rosaline Bane, NP, or Reche Finder, NP

## 2024-09-15 DIAGNOSIS — M25561 Pain in right knee: Secondary | ICD-10-CM | POA: Diagnosis not present

## 2024-09-15 DIAGNOSIS — M6281 Muscle weakness (generalized): Secondary | ICD-10-CM | POA: Diagnosis not present

## 2024-09-15 DIAGNOSIS — M25562 Pain in left knee: Secondary | ICD-10-CM | POA: Diagnosis not present

## 2024-09-19 DIAGNOSIS — M6281 Muscle weakness (generalized): Secondary | ICD-10-CM | POA: Diagnosis not present

## 2024-09-19 DIAGNOSIS — M25562 Pain in left knee: Secondary | ICD-10-CM | POA: Diagnosis not present

## 2024-09-19 DIAGNOSIS — M25561 Pain in right knee: Secondary | ICD-10-CM | POA: Diagnosis not present

## 2024-09-27 DIAGNOSIS — M6281 Muscle weakness (generalized): Secondary | ICD-10-CM | POA: Diagnosis not present

## 2024-09-27 DIAGNOSIS — M25562 Pain in left knee: Secondary | ICD-10-CM | POA: Diagnosis not present

## 2024-09-27 DIAGNOSIS — M25561 Pain in right knee: Secondary | ICD-10-CM | POA: Diagnosis not present

## 2024-11-07 ENCOUNTER — Ambulatory Visit (INDEPENDENT_AMBULATORY_CARE_PROVIDER_SITE_OTHER): Payer: Medicare PPO

## 2024-11-07 DIAGNOSIS — I442 Atrioventricular block, complete: Secondary | ICD-10-CM

## 2024-11-08 LAB — CUP PACEART REMOTE DEVICE CHECK
Battery Remaining Longevity: 118 mo
Battery Voltage: 3.02 V
Brady Statistic AP VP Percent: 44.05 %
Brady Statistic AP VS Percent: 0.01 %
Brady Statistic AS VP Percent: 55.6 %
Brady Statistic AS VS Percent: 0.34 %
Brady Statistic RA Percent Paced: 44.16 %
Brady Statistic RV Percent Paced: 99.65 %
Date Time Interrogation Session: 20260112113610
Implantable Lead Connection Status: 753985
Implantable Lead Connection Status: 753985
Implantable Lead Implant Date: 20230113
Implantable Lead Implant Date: 20230113
Implantable Lead Location: 753859
Implantable Lead Location: 753860
Implantable Lead Model: 3830
Implantable Lead Model: 5076
Implantable Pulse Generator Implant Date: 20230113
Lead Channel Impedance Value: 361 Ohm
Lead Channel Impedance Value: 380 Ohm
Lead Channel Impedance Value: 494 Ohm
Lead Channel Impedance Value: 513 Ohm
Lead Channel Pacing Threshold Amplitude: 0.625 V
Lead Channel Pacing Threshold Amplitude: 0.75 V
Lead Channel Pacing Threshold Pulse Width: 0.4 ms
Lead Channel Pacing Threshold Pulse Width: 0.4 ms
Lead Channel Sensing Intrinsic Amplitude: 11 mV
Lead Channel Sensing Intrinsic Amplitude: 11 mV
Lead Channel Sensing Intrinsic Amplitude: 4.25 mV
Lead Channel Sensing Intrinsic Amplitude: 4.25 mV
Lead Channel Setting Pacing Amplitude: 1.5 V
Lead Channel Setting Pacing Amplitude: 2 V
Lead Channel Setting Pacing Pulse Width: 0.4 ms
Lead Channel Setting Sensing Sensitivity: 1.2 mV
Zone Setting Status: 755011
Zone Setting Status: 755011

## 2024-11-09 ENCOUNTER — Ambulatory Visit: Payer: Self-pay | Admitting: Cardiology

## 2024-11-11 NOTE — Progress Notes (Signed)
 Remote PPM Transmission
